# Patient Record
Sex: Male | Born: 1950 | Race: White | Hispanic: No | Marital: Married | Smoking: Current every day smoker
Health system: Southern US, Community
[De-identification: ages and names within clinical notes are randomized; demographics above are authoritative.]

## PROBLEM LIST (undated history)

## (undated) DIAGNOSIS — G709 Myoneural disorder, unspecified: Secondary | ICD-10-CM

## (undated) DIAGNOSIS — I10 Essential (primary) hypertension: Secondary | ICD-10-CM

## (undated) HISTORY — PX: TONSILLECTOMY: SUR1361

---

## 1991-02-08 HISTORY — PX: VASECTOMY: SHX75

## 1998-09-20 ENCOUNTER — Emergency Department (HOSPITAL_COMMUNITY): Admission: EM | Admit: 1998-09-20 | Discharge: 1998-09-20 | Payer: Self-pay | Admitting: Emergency Medicine

## 1998-09-20 ENCOUNTER — Encounter: Payer: Self-pay | Admitting: Emergency Medicine

## 2006-02-07 DIAGNOSIS — I10 Essential (primary) hypertension: Secondary | ICD-10-CM

## 2006-02-07 HISTORY — DX: Essential (primary) hypertension: I10

## 2009-08-19 ENCOUNTER — Emergency Department (HOSPITAL_COMMUNITY): Admission: EM | Admit: 2009-08-19 | Discharge: 2009-08-20 | Payer: Self-pay | Admitting: Emergency Medicine

## 2009-08-27 ENCOUNTER — Emergency Department (HOSPITAL_COMMUNITY): Admission: EM | Admit: 2009-08-27 | Discharge: 2009-08-27 | Payer: Self-pay | Admitting: Emergency Medicine

## 2010-04-24 LAB — DIFFERENTIAL
Basophils Absolute: 0 10*3/uL (ref 0.0–0.1)
Basophils Relative: 0 % (ref 0–1)
Eosinophils Absolute: 0.1 10*3/uL (ref 0.0–0.7)
Eosinophils Relative: 1 % (ref 0–5)
Lymphocytes Relative: 18 % (ref 12–46)
Lymphs Abs: 1.4 10*3/uL (ref 0.7–4.0)
Monocytes Absolute: 0.5 10*3/uL (ref 0.1–1.0)
Monocytes Relative: 7 % (ref 3–12)
Neutro Abs: 5.7 10*3/uL (ref 1.7–7.7)
Neutrophils Relative %: 74 % (ref 43–77)

## 2010-04-24 LAB — URINALYSIS, ROUTINE W REFLEX MICROSCOPIC
Bilirubin Urine: NEGATIVE
Glucose, UA: NEGATIVE mg/dL
Hgb urine dipstick: NEGATIVE
Ketones, ur: NEGATIVE mg/dL
Nitrite: NEGATIVE
Protein, ur: NEGATIVE mg/dL
Specific Gravity, Urine: 1.016 (ref 1.005–1.030)
Urobilinogen, UA: 0.2 mg/dL (ref 0.0–1.0)
pH: 8 (ref 5.0–8.0)

## 2010-04-24 LAB — MAGNESIUM: Magnesium: 2 mg/dL (ref 1.5–2.5)

## 2010-04-24 LAB — CBC
HCT: 45 % (ref 39.0–52.0)
Hemoglobin: 15.6 g/dL (ref 13.0–17.0)
MCH: 34.6 pg — ABNORMAL HIGH (ref 26.0–34.0)
MCHC: 34.6 g/dL (ref 30.0–36.0)
MCV: 99.8 fL (ref 78.0–100.0)
Platelets: 154 10*3/uL (ref 150–400)
RBC: 4.51 MIL/uL (ref 4.22–5.81)
RDW: 12.9 % (ref 11.5–15.5)
WBC: 7.8 10*3/uL (ref 4.0–10.5)

## 2010-04-24 LAB — PHOSPHORUS: Phosphorus: 3.9 mg/dL (ref 2.3–4.6)

## 2010-04-24 LAB — BASIC METABOLIC PANEL
BUN: 9 mg/dL (ref 6–23)
CO2: 26 mEq/L (ref 19–32)
Calcium: 8.9 mg/dL (ref 8.4–10.5)
Chloride: 97 mEq/L (ref 96–112)
Creatinine, Ser: 0.93 mg/dL (ref 0.4–1.5)
GFR calc Af Amer: >60 mL/min (ref >60)
GFR calc non Af Amer: >60 mL/min (ref >60)
Glucose, Bld: 115 mg/dL — ABNORMAL HIGH (ref 70–99)
Potassium: 3.7 mEq/L (ref 3.5–5.1)
Sodium: 131 mEq/L — ABNORMAL LOW (ref 135–145)

## 2010-04-25 LAB — COMPREHENSIVE METABOLIC PANEL WITH GFR
ALT: 15 U/L (ref 0–53)
AST: 25 U/L (ref 0–37)
Albumin: 4.1 g/dL (ref 3.5–5.2)
Alkaline Phosphatase: 40 U/L (ref 39–117)
BUN: 7 mg/dL (ref 6–23)
CO2: 25 meq/L (ref 19–32)
Calcium: 9.1 mg/dL (ref 8.4–10.5)
Chloride: 96 meq/L (ref 96–112)
Creatinine, Ser: 1.02 mg/dL (ref 0.4–1.5)
GFR calc Af Amer: >60 mL/min (ref >60)
GFR calc non Af Amer: >60 mL/min (ref >60)
Glucose, Bld: 111 mg/dL — ABNORMAL HIGH (ref 70–99)
Potassium: 3.6 meq/L (ref 3.5–5.1)
Sodium: 133 meq/L — ABNORMAL LOW (ref 135–145)
Total Bilirubin: 0.7 mg/dL (ref 0.3–1.2)
Total Protein: 6.7 g/dL (ref 6.0–8.3)

## 2010-04-25 LAB — ETHANOL: Alcohol, Ethyl (B): 85 mg/dL — ABNORMAL HIGH (ref 0–10)

## 2010-04-25 LAB — CBC
MCH: 34.3 pg — ABNORMAL HIGH (ref 26.0–34.0)
MCV: 99.5 fL (ref 78.0–100.0)
Platelets: 147 10*3/uL — ABNORMAL LOW (ref 150–400)
RDW: 13 % (ref 11.5–15.5)
WBC: 8.2 10*3/uL (ref 4.0–10.5)

## 2010-04-25 LAB — DIFFERENTIAL
Basophils Absolute: 0.1 K/uL (ref 0.0–0.1)
Basophils Relative: 1 % (ref 0–1)
Eosinophils Absolute: 0.2 K/uL (ref 0.0–0.7)
Eosinophils Relative: 3 % (ref 0–5)
Lymphocytes Relative: 21 % (ref 12–46)
Lymphs Abs: 1.7 K/uL (ref 0.7–4.0)
Monocytes Absolute: 0.5 K/uL (ref 0.1–1.0)
Monocytes Relative: 6 % (ref 3–12)
Neutro Abs: 5.7 K/uL (ref 1.7–7.7)
Neutrophils Relative %: 70 % (ref 43–77)

## 2011-02-17 NOTE — H&P (Signed)
NTS SOAP Note  Vital Signs:  Vitals as of: 02/17/2011: Systolic 164: Diastolic 105: Heart Rate 91: Temp 99.55F: Height 37ft 2in: Weight 196Lbs 0 Ounces: Pain Level 0: BMI 25  BMI : 25.16 kg/m2  Subjective: This 74 Years 24 Months old Male presents forscreening TCS.  Denies any GI complaints.  No family h/o colon carcinoma.  Review of Symptoms:  Constitutional:unremarkable Head:unremarkable Eyes:unremarkable Nose/Mouth/Throat:unremarkable Cardiovascular:unremarkable Respiratory:unremarkable Gastrointestinal:unremarkable Genitourinary:unremarkable joint pain Skin:unremarkable Hematolgic/Lymphatic:unremarkable Allergic/Immunologic:unremarkable   Past Medical History:Reviewed   Past Medical History  Surgical History: none Medical Problems:  High Blood pressure Allergies: nkda Medications: lisinopril   Social History:Reviewed   Social History  Preferred Language: English ([place]) Race:  White Ethnicity: Not Hispanic / Latino Age: 61 Years 3 Months Marital Status:  W Alcohol:  Yes, How much 12 per day Recreational drug(s):  No   Smoking Status: Current every day smoker reviewed on 02/17/2011 Started Date: 02/07/1973 Packs per day: 2.00   Family History:Reviewed   Family History  Is there a family history of:No family h/o colon cancer    Objective Information: General:Well appearing, well nourished in no distress. Skin:no rash or prominent lesions Head:Atraumatic; no masses; no abnormalities Neck:Supple without lymphadenopathy.  Heart:RRR, no murmur or gallop.  Normal S1, S2.  No S3, S4.  Lungs:CTA bilaterally, no wheezes, rhonchi, rales.  Breathing unlabored. Abdomen:Soft, NT/ND, no HSM, no masses. deferred to procedure  Assessment:Need for screening TCS  Diagnosis &amp; Procedure: DiagnosisCode: V76.51, ProcedureCode: 08657,    Plan:Scheduled for TCS on  02/22/11.   Patient Education:Alternative treatments to surgery were discussed with patient (and family).Risks and benefits  of procedure were fully explained to the patient (and family) who gave informed consent. Patient/family questions were addressed.  Follow-up:Pending Surgery

## 2011-02-21 MED ORDER — SODIUM CHLORIDE 0.45 % IV SOLN
Freq: Once | INTRAVENOUS | Status: AC
  Administered 2011-02-22: 20 mL via INTRAVENOUS

## 2011-02-22 ENCOUNTER — Encounter (HOSPITAL_COMMUNITY): Payer: Self-pay | Admitting: *Deleted

## 2011-02-22 ENCOUNTER — Encounter (HOSPITAL_COMMUNITY)
Admission: RE | Disposition: A | Discharge status: Home / Self Care | Payer: Self-pay | Source: Ambulatory Visit | Attending: General Surgery

## 2011-02-22 ENCOUNTER — Ambulatory Visit (HOSPITAL_COMMUNITY)
Admission: RE | Admit: 2011-02-22 | Discharge: 2011-02-22 | Disposition: A | Discharge status: Home / Self Care | Payer: Managed Care, Other (non HMO) | Source: Ambulatory Visit | Attending: General Surgery | Admitting: General Surgery

## 2011-02-22 DIAGNOSIS — Z79899 Other long term (current) drug therapy: Secondary | ICD-10-CM | POA: Insufficient documentation

## 2011-02-22 DIAGNOSIS — K648 Other hemorrhoids: Secondary | ICD-10-CM | POA: Insufficient documentation

## 2011-02-22 DIAGNOSIS — I10 Essential (primary) hypertension: Secondary | ICD-10-CM | POA: Insufficient documentation

## 2011-02-22 DIAGNOSIS — Z1211 Encounter for screening for malignant neoplasm of colon: Secondary | ICD-10-CM | POA: Insufficient documentation

## 2011-02-22 HISTORY — PX: COLONOSCOPY: SHX5424

## 2011-02-22 HISTORY — DX: Essential (primary) hypertension: I10

## 2011-02-22 HISTORY — DX: Myoneural disorder, unspecified: G70.9

## 2011-02-22 SURGERY — COLONOSCOPY
Anesthesia: Moderate Sedation

## 2011-02-22 MED ORDER — MEPERIDINE HCL 100 MG/ML IJ SOLN
INTRAMUSCULAR | Status: AC
  Filled 2011-02-22: qty 1

## 2011-02-22 MED ORDER — MIDAZOLAM HCL 5 MG/5ML IJ SOLN
INTRAMUSCULAR | Status: AC
  Filled 2011-02-22: qty 10

## 2011-02-22 MED ORDER — MIDAZOLAM HCL 5 MG/5ML IJ SOLN
INTRAMUSCULAR | Status: DC | PRN
  Administered 2011-02-22: 4 mg via INTRAVENOUS
  Administered 2011-02-22: 1 mg via INTRAVENOUS

## 2011-02-22 MED ORDER — SIMETHICONE 40 MG/0.6ML PO SUSP
ORAL | Status: DC | PRN
  Administered 2011-02-22: 08:00:00

## 2011-02-22 MED ORDER — MEPERIDINE HCL 25 MG/ML IJ SOLN
INTRAMUSCULAR | Status: DC | PRN
  Administered 2011-02-22: 50 mg via INTRAVENOUS

## 2011-02-22 NOTE — Op Note (Signed)
[facility] [address] [place], [place]  [address]  COLONOSCOPY PROCEDURE REPORT  PATIENT:  Kenneth Reeves, Kenneth Reeves  MR#:  [id number] BIRTHDATE:  Jan 02, 1951, 60 yrs. old  GENDER:  male ENDOSCOPIST:  Franky Macho, MD REF. BY:  Karleen Hampshire, M.D. PROCEDURE DATE:  02/22/2011 PROCEDURE:  Average-risk screening colonoscopy G0121 ASA CLASS:  Class II INDICATIONS:  Screening MEDICATIONS:   Versed 5 mg IV, demerol 50 mg IV  DESCRIPTION OF PROCEDURE:   After the risks benefits and alternatives of the procedure were thoroughly explained, informed consent was obtained.  Digital rectal exam was performed and revealed no abnormalities.   The [id number] ([id number]) endoscope was introduced through the anus and advanced to the cecum, which was identified by both the appendix and ileocecal valve, without limitations.  The quality of the prep was adequate..  The instrument was then slowly withdrawn as the colon was fully examined.  FINDINGS:  A normal appearing cecum, ileocecal valve, and appendiceal orifice were identified. The ascending, hepatic flexure, transverse, splenic flexure, descending, sigmoid colon, and rectum appeared unremarkable.   Retroflexed views in the rectum revealed internal hemorrhoids. The scope was then withdrawn from the cecum and the procedure completed. COMPLICATIONS:  None ENDOSCOPIC IMPRESSION: 1) Normal colon 2) Internal hemorrhoids RECOMMENDATIONS:  REPEAT EXAM:  In 10 year(s) for Colonoscopy.  ______________________________ Franky Macho, MD  CC:  Karleen Hampshire, MD  n. Rosalie DoctorFranky Macho at 02/22/2011 08:57 AM  Renelda Loma, [phone]

## 2011-03-01 ENCOUNTER — Encounter (HOSPITAL_COMMUNITY): Payer: Self-pay | Admitting: General Surgery

## 2013-11-20 ENCOUNTER — Emergency Department (HOSPITAL_COMMUNITY)
Admission: EM | Admit: 2013-11-20 | Discharge: 2013-11-20 | Disposition: A | Discharge status: Home / Self Care | Payer: No Typology Code available for payment source | Attending: Emergency Medicine | Admitting: Emergency Medicine

## 2013-11-20 ENCOUNTER — Encounter (HOSPITAL_COMMUNITY): Payer: Self-pay | Admitting: Emergency Medicine

## 2013-11-20 ENCOUNTER — Emergency Department (HOSPITAL_COMMUNITY): Payer: No Typology Code available for payment source

## 2013-11-20 DIAGNOSIS — Z720 Tobacco use: Secondary | ICD-10-CM | POA: Insufficient documentation

## 2013-11-20 DIAGNOSIS — Z79899 Other long term (current) drug therapy: Secondary | ICD-10-CM | POA: Insufficient documentation

## 2013-11-20 DIAGNOSIS — Z8669 Personal history of other diseases of the nervous system and sense organs: Secondary | ICD-10-CM | POA: Insufficient documentation

## 2013-11-20 DIAGNOSIS — S3992XA Unspecified injury of lower back, initial encounter: Secondary | ICD-10-CM | POA: Diagnosis present

## 2013-11-20 DIAGNOSIS — Y92410 Unspecified street and highway as the place of occurrence of the external cause: Secondary | ICD-10-CM | POA: Insufficient documentation

## 2013-11-20 DIAGNOSIS — S39012A Strain of muscle, fascia and tendon of lower back, initial encounter: Secondary | ICD-10-CM | POA: Insufficient documentation

## 2013-11-20 DIAGNOSIS — Y9389 Activity, other specified: Secondary | ICD-10-CM | POA: Insufficient documentation

## 2013-11-20 DIAGNOSIS — I10 Essential (primary) hypertension: Secondary | ICD-10-CM | POA: Diagnosis not present

## 2013-11-20 MED ORDER — CYCLOBENZAPRINE HCL 5 MG PO TABS: 5.0000 mg | ORAL_TABLET | Freq: Three times a day (TID) | ORAL | Status: DC | PRN

## 2013-11-20 MED ORDER — IBUPROFEN 600 MG PO TABS: 600.0000 mg | ORAL_TABLET | Freq: Four times a day (QID) | ORAL | Status: DC | PRN
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
## 2013-11-21 NOTE — ED Provider Notes (Signed)
Medical screening examination/treatment/procedure(s) were performed by non-physician practitioner and as supervising physician I was immediately available for consultation/collaboration.   EKG Interpretation None        Francine Graven, DO 11/21/13 [phone]

## 2018-01-29 DIAGNOSIS — N138 Other obstructive and reflux uropathy: Secondary | ICD-10-CM | POA: Diagnosis not present

## 2018-01-29 DIAGNOSIS — N401 Enlarged prostate with lower urinary tract symptoms: Secondary | ICD-10-CM | POA: Diagnosis not present

## 2018-01-29 DIAGNOSIS — R339 Retention of urine, unspecified: Secondary | ICD-10-CM | POA: Diagnosis not present

## 2018-02-06 ENCOUNTER — Ambulatory Visit (HOSPITAL_COMMUNITY)
Admission: EM | Admit: 2018-02-06 | Discharge: 2018-02-06 | Disposition: A | Discharge status: Home / Self Care | Payer: Medicare HMO | Attending: Family Medicine | Admitting: Family Medicine

## 2018-02-06 ENCOUNTER — Encounter (HOSPITAL_COMMUNITY): Payer: Self-pay

## 2018-02-06 DIAGNOSIS — I160 Hypertensive urgency: Secondary | ICD-10-CM

## 2018-02-06 DIAGNOSIS — I10 Essential (primary) hypertension: Secondary | ICD-10-CM | POA: Insufficient documentation

## 2018-02-06 MED ORDER — LOSARTAN POTASSIUM-HCTZ 50-12.5 MG PO TABS: 1.0000 | ORAL_TABLET | Freq: Every day | ORAL | 2 refills | Status: AC

## 2018-02-06 NOTE — ED Triage Notes (Signed)
Pt presents with elevated blood pressure.  Pt has no complaints of any other symptoms or any pain.

## 2018-02-06 NOTE — ED Notes (Signed)
Blood Pressure rechecked 178/127

## 2018-02-06 NOTE — Discharge Instructions (Addendum)
Losartan-HCTZ prescribed.  Take as directed. Please continue to monitor blood pressure at home and keep a log Eat a well balanced diet of fruits, vegetables and lean meats.  Avoid foods high in fat and salt Drink water.  At least half your body weight in ounces Exercise for at least 30 minutes daily PCP assistance initiated  Please follow up with PCP for further evaluation and management of high blood pressure I have included information for two local PCPs if you are not able to get in with your PCP Return or go to the ED if you have any new or worsening symptoms such as vision changes, fatigue, dizziness, chest pain, shortness of breath, nausea, swelling in your hands or feet, urinary symptoms, etc..Marland Kitchen

## 2018-02-06 NOTE — ED Provider Notes (Signed)
Kenneth Reeves   [id number] 02/06/18 Arrival Time: 6283  CC: Elevated blood pressure  SUBJECTIVE:  Kenneth Reeves is a 67 y.o. male who presents with elevated blood pressure.  Hx of HTN x 10-15 years.  Was on lisinopril in the past, but discontinued due to side effects.  Was seen by Urologist yesterday, and blood pressure was 185/127. 192/136 in office.  Pt tried to follow up with PCP, but was unable to get an appointment.  Does not have a PCP currently. Denies HA, vision changes, dizziness, lightheadedness, chest pain, shortness of breath, numbness or tingling in extremities, abdominal pain, changes in bowel or bladder habits.    Father died of MI at age of 55 Admits to tobacco use 1.5-2 PPD x 40+ years  ROS: As per HPI.  Past Medical History:  Diagnosis Date  . Hypertension 2008  . Neuromuscular disorder ([place])    Bad knee   Past Surgical History:  Procedure Laterality Date  . COLONOSCOPY  02/22/2011   Procedure: COLONOSCOPY;  Surgeon: Jamesetta So, MD;  Location: [facility];  Service: Gastroenterology;  Laterality: N/A;  . TONSILLECTOMY    . VASECTOMY  1993   No Known Allergies No current facility-administered medications on file prior to encounter.    No current outpatient medications on file prior to encounter.   Social History   Socioeconomic History  . Marital status: Married    Spouse name: Not on file  . Number of children: Not on file  . Years of education: Not on file  . Highest education level: Not on file  Occupational History  . Not on file  Social Needs  . Financial resource strain: Not on file  . Food insecurity:    Worry: Not on file    Inability: Not on file  . Transportation needs:    Medical: Not on file    Non-medical: Not on file  Tobacco Use  . Smoking status: Current Every Day Smoker    Packs/day: 2.00  Substance and Sexual Activity  . Alcohol use: Yes    Comment: few beers per day.  . Drug use: No  . Sexual activity: Not on  file  Lifestyle  . Physical activity:    Days per week: Not on file    Minutes per session: Not on file  . Stress: Not on file  Relationships  . Social connections:    Talks on phone: Not on file    Gets together: Not on file    Attends religious service: Not on file    Active member of club or organization: Not on file    Attends meetings of clubs or organizations: Not on file    Relationship status: Not on file  . Intimate partner violence:    Fear of current or ex partner: Not on file    Emotionally abused: Not on file    Physically abused: Not on file    Forced sexual activity: Not on file  Other Topics Concern  . Not on file  Social History Narrative  . Not on file   Family History  Problem Relation Age of Onset  . Heart attack Father     OBJECTIVE:  Vitals:   02/06/18 0845 02/06/18 0923  BP: (!) 192/136 (!) 178/127  Pulse: 93   Resp: 20   Temp: (!) 97.3 F (36.3 C)   TempSrc: Oral   SpO2: 97%     General appearance: alert; no distress Eyes: PERRLA; EOMI HENT: normocephalic;  atraumatic Neck: supple with FROM; no bruit Lungs: clear to auscultation bilaterally; normal respiratory effort Heart: regular rate and rhythm.  Radial pulses 2+ symmetrical bilaterally Skin: warm and dry Neurologic: CN 2-12 grossly intact;  normal gait Psychological: alert and cooperative; normal mood and affect   ASSESSMENT & PLAN:  1. Hypertensive urgency   2. Essential hypertension     Meds ordered this encounter  Medications  . losartan-hydrochlorothiazide (HYZAAR) 50-12.5 MG tablet    Sig: Take 1 tablet by mouth daily.    Dispense:  30 tablet    Refill:  2    Order Specific Question:   Supervising Provider    Answer:   Raylene Everts [[id number]]   Losartan-HCTZ prescribed.  Take as directed. Please continue to monitor blood pressure at home and keep a log Eat a well balanced diet of fruits, vegetables and lean meats.  Avoid foods high in fat and salt Drink water.   At least half your body weight in ounces Exercise for at least 30 minutes daily PCP assistance initiated  Please follow up with PCP for further evaluation and management of high blood pressure I have included information for two local PCPs if you are not able to get in with your PCP Return or go to the ED if you have any new or worsening symptoms such as vision changes, fatigue, dizziness, chest pain, shortness of breath, nausea, swelling in your hands or feet, urinary symptoms, etc...   Reviewed expectations re: course of current medical issues. Questions answered. Outlined signs and symptoms indicating need for more acute intervention. Patient verbalized understanding. After Visit Summary given.   Lestine Box, PA-C 02/06/18 1100

## 2018-02-07 DIAGNOSIS — C801 Malignant (primary) neoplasm, unspecified: Secondary | ICD-10-CM

## 2018-02-07 HISTORY — DX: Malignant (primary) neoplasm, unspecified: C80.1

## 2018-02-15 DIAGNOSIS — R972 Elevated prostate specific antigen [PSA]: Secondary | ICD-10-CM | POA: Diagnosis not present

## 2018-02-16 DIAGNOSIS — R69 Illness, unspecified: Secondary | ICD-10-CM | POA: Diagnosis not present

## 2018-02-16 DIAGNOSIS — R972 Elevated prostate specific antigen [PSA]: Secondary | ICD-10-CM | POA: Diagnosis not present

## 2018-02-23 DIAGNOSIS — C61 Malignant neoplasm of prostate: Secondary | ICD-10-CM | POA: Diagnosis not present

## 2018-02-23 DIAGNOSIS — R972 Elevated prostate specific antigen [PSA]: Secondary | ICD-10-CM | POA: Diagnosis not present

## 2018-03-05 DIAGNOSIS — R350 Frequency of micturition: Secondary | ICD-10-CM | POA: Diagnosis not present

## 2018-03-05 DIAGNOSIS — I10 Essential (primary) hypertension: Secondary | ICD-10-CM | POA: Diagnosis not present

## 2018-03-05 DIAGNOSIS — C61 Malignant neoplasm of prostate: Secondary | ICD-10-CM | POA: Diagnosis not present

## 2018-03-08 DIAGNOSIS — E663 Overweight: Secondary | ICD-10-CM | POA: Diagnosis not present

## 2018-03-08 DIAGNOSIS — C61 Malignant neoplasm of prostate: Secondary | ICD-10-CM | POA: Diagnosis not present

## 2018-03-08 DIAGNOSIS — Z0001 Encounter for general adult medical examination with abnormal findings: Secondary | ICD-10-CM | POA: Diagnosis not present

## 2018-03-08 DIAGNOSIS — Z6828 Body mass index (BMI) 28.0-28.9, adult: Secondary | ICD-10-CM | POA: Diagnosis not present

## 2018-03-08 DIAGNOSIS — I10 Essential (primary) hypertension: Secondary | ICD-10-CM | POA: Diagnosis not present

## 2018-03-19 DIAGNOSIS — I10 Essential (primary) hypertension: Secondary | ICD-10-CM | POA: Diagnosis not present

## 2018-03-19 DIAGNOSIS — R911 Solitary pulmonary nodule: Secondary | ICD-10-CM | POA: Diagnosis not present

## 2018-03-19 DIAGNOSIS — R69 Illness, unspecified: Secondary | ICD-10-CM | POA: Diagnosis not present

## 2018-03-19 DIAGNOSIS — C61 Malignant neoplasm of prostate: Secondary | ICD-10-CM | POA: Diagnosis not present

## 2018-03-19 DIAGNOSIS — C772 Secondary and unspecified malignant neoplasm of intra-abdominal lymph nodes: Secondary | ICD-10-CM | POA: Diagnosis not present

## 2018-03-19 DIAGNOSIS — R590 Localized enlarged lymph nodes: Secondary | ICD-10-CM | POA: Diagnosis not present

## 2018-03-19 DIAGNOSIS — N1330 Unspecified hydronephrosis: Secondary | ICD-10-CM | POA: Diagnosis not present

## 2018-03-21 DIAGNOSIS — R69 Illness, unspecified: Secondary | ICD-10-CM | POA: Diagnosis not present

## 2018-04-02 DIAGNOSIS — I10 Essential (primary) hypertension: Secondary | ICD-10-CM | POA: Diagnosis not present

## 2018-04-02 DIAGNOSIS — J432 Centrilobular emphysema: Secondary | ICD-10-CM | POA: Diagnosis not present

## 2018-04-02 DIAGNOSIS — R918 Other nonspecific abnormal finding of lung field: Secondary | ICD-10-CM | POA: Diagnosis not present

## 2018-04-02 DIAGNOSIS — R739 Hyperglycemia, unspecified: Secondary | ICD-10-CM | POA: Diagnosis not present

## 2018-04-02 DIAGNOSIS — C61 Malignant neoplasm of prostate: Secondary | ICD-10-CM | POA: Diagnosis not present

## 2018-04-02 DIAGNOSIS — Z79899 Other long term (current) drug therapy: Secondary | ICD-10-CM | POA: Diagnosis not present

## 2018-04-09 DIAGNOSIS — C61 Malignant neoplasm of prostate: Secondary | ICD-10-CM | POA: Diagnosis not present

## 2018-04-23 DIAGNOSIS — R911 Solitary pulmonary nodule: Secondary | ICD-10-CM | POA: Diagnosis not present

## 2018-04-23 DIAGNOSIS — Z79899 Other long term (current) drug therapy: Secondary | ICD-10-CM | POA: Diagnosis not present

## 2018-04-23 DIAGNOSIS — K5900 Constipation, unspecified: Secondary | ICD-10-CM | POA: Diagnosis not present

## 2018-04-23 DIAGNOSIS — C61 Malignant neoplasm of prostate: Secondary | ICD-10-CM | POA: Diagnosis not present

## 2018-04-23 DIAGNOSIS — R739 Hyperglycemia, unspecified: Secondary | ICD-10-CM | POA: Diagnosis not present

## 2018-04-23 DIAGNOSIS — D709 Neutropenia, unspecified: Secondary | ICD-10-CM | POA: Diagnosis not present

## 2018-04-23 DIAGNOSIS — K30 Functional dyspepsia: Secondary | ICD-10-CM | POA: Diagnosis not present

## 2018-04-23 DIAGNOSIS — I10 Essential (primary) hypertension: Secondary | ICD-10-CM | POA: Diagnosis not present

## 2018-04-24 DIAGNOSIS — Z7689 Persons encountering health services in other specified circumstances: Secondary | ICD-10-CM | POA: Diagnosis not present

## 2018-04-24 DIAGNOSIS — C61 Malignant neoplasm of prostate: Secondary | ICD-10-CM | POA: Diagnosis not present

## 2018-05-14 DIAGNOSIS — I10 Essential (primary) hypertension: Secondary | ICD-10-CM | POA: Diagnosis not present

## 2018-05-14 DIAGNOSIS — C61 Malignant neoplasm of prostate: Secondary | ICD-10-CM | POA: Diagnosis not present

## 2018-05-14 DIAGNOSIS — D709 Neutropenia, unspecified: Secondary | ICD-10-CM | POA: Diagnosis not present

## 2018-05-14 DIAGNOSIS — R911 Solitary pulmonary nodule: Secondary | ICD-10-CM | POA: Diagnosis not present

## 2018-05-15 DIAGNOSIS — C61 Malignant neoplasm of prostate: Secondary | ICD-10-CM | POA: Diagnosis not present

## 2018-06-04 DIAGNOSIS — R110 Nausea: Secondary | ICD-10-CM | POA: Diagnosis not present

## 2018-06-04 DIAGNOSIS — Z191 Hormone sensitive malignancy status: Secondary | ICD-10-CM | POA: Diagnosis not present

## 2018-06-04 DIAGNOSIS — C61 Malignant neoplasm of prostate: Secondary | ICD-10-CM | POA: Diagnosis not present

## 2018-06-04 DIAGNOSIS — Z5111 Encounter for antineoplastic chemotherapy: Secondary | ICD-10-CM | POA: Diagnosis not present

## 2018-06-04 DIAGNOSIS — D709 Neutropenia, unspecified: Secondary | ICD-10-CM | POA: Diagnosis not present

## 2018-06-04 DIAGNOSIS — R5383 Other fatigue: Secondary | ICD-10-CM | POA: Diagnosis not present

## 2018-06-04 DIAGNOSIS — Z79899 Other long term (current) drug therapy: Secondary | ICD-10-CM | POA: Diagnosis not present

## 2018-06-04 DIAGNOSIS — I10 Essential (primary) hypertension: Secondary | ICD-10-CM | POA: Diagnosis not present

## 2018-06-04 DIAGNOSIS — C772 Secondary and unspecified malignant neoplasm of intra-abdominal lymph nodes: Secondary | ICD-10-CM | POA: Diagnosis not present

## 2018-06-04 DIAGNOSIS — R911 Solitary pulmonary nodule: Secondary | ICD-10-CM | POA: Diagnosis not present

## 2018-06-05 DIAGNOSIS — C61 Malignant neoplasm of prostate: Secondary | ICD-10-CM | POA: Diagnosis not present

## 2018-06-05 DIAGNOSIS — C772 Secondary and unspecified malignant neoplasm of intra-abdominal lymph nodes: Secondary | ICD-10-CM | POA: Diagnosis not present

## 2018-06-25 DIAGNOSIS — R5383 Other fatigue: Secondary | ICD-10-CM | POA: Diagnosis not present

## 2018-06-25 DIAGNOSIS — I10 Essential (primary) hypertension: Secondary | ICD-10-CM | POA: Diagnosis not present

## 2018-06-25 DIAGNOSIS — Z5111 Encounter for antineoplastic chemotherapy: Secondary | ICD-10-CM | POA: Diagnosis not present

## 2018-06-25 DIAGNOSIS — Z79899 Other long term (current) drug therapy: Secondary | ICD-10-CM | POA: Diagnosis not present

## 2018-06-25 DIAGNOSIS — C61 Malignant neoplasm of prostate: Secondary | ICD-10-CM | POA: Diagnosis not present

## 2018-06-25 DIAGNOSIS — D709 Neutropenia, unspecified: Secondary | ICD-10-CM | POA: Diagnosis not present

## 2018-06-26 DIAGNOSIS — Z7689 Persons encountering health services in other specified circumstances: Secondary | ICD-10-CM | POA: Diagnosis not present

## 2018-06-26 DIAGNOSIS — C61 Malignant neoplasm of prostate: Secondary | ICD-10-CM | POA: Diagnosis not present

## 2018-07-16 DIAGNOSIS — R42 Dizziness and giddiness: Secondary | ICD-10-CM | POA: Diagnosis not present

## 2018-07-16 DIAGNOSIS — I10 Essential (primary) hypertension: Secondary | ICD-10-CM | POA: Diagnosis not present

## 2018-07-16 DIAGNOSIS — D709 Neutropenia, unspecified: Secondary | ICD-10-CM | POA: Diagnosis not present

## 2018-07-16 DIAGNOSIS — R110 Nausea: Secondary | ICD-10-CM | POA: Diagnosis not present

## 2018-07-16 DIAGNOSIS — R351 Nocturia: Secondary | ICD-10-CM | POA: Diagnosis not present

## 2018-07-16 DIAGNOSIS — Z79899 Other long term (current) drug therapy: Secondary | ICD-10-CM | POA: Diagnosis not present

## 2018-07-16 DIAGNOSIS — C61 Malignant neoplasm of prostate: Secondary | ICD-10-CM | POA: Diagnosis not present

## 2018-07-16 DIAGNOSIS — R5383 Other fatigue: Secondary | ICD-10-CM | POA: Diagnosis not present

## 2018-07-16 DIAGNOSIS — R911 Solitary pulmonary nodule: Secondary | ICD-10-CM | POA: Diagnosis not present

## 2018-07-17 DIAGNOSIS — C61 Malignant neoplasm of prostate: Secondary | ICD-10-CM | POA: Diagnosis not present

## 2018-08-13 DIAGNOSIS — R911 Solitary pulmonary nodule: Secondary | ICD-10-CM | POA: Diagnosis not present

## 2018-08-13 DIAGNOSIS — I10 Essential (primary) hypertension: Secondary | ICD-10-CM | POA: Diagnosis not present

## 2018-08-13 DIAGNOSIS — C61 Malignant neoplasm of prostate: Secondary | ICD-10-CM | POA: Diagnosis not present

## 2018-08-13 DIAGNOSIS — Z9221 Personal history of antineoplastic chemotherapy: Secondary | ICD-10-CM | POA: Diagnosis not present

## 2018-08-13 DIAGNOSIS — K7689 Other specified diseases of liver: Secondary | ICD-10-CM | POA: Diagnosis not present

## 2018-08-13 DIAGNOSIS — Z191 Hormone sensitive malignancy status: Secondary | ICD-10-CM | POA: Diagnosis not present

## 2018-09-17 DIAGNOSIS — I10 Essential (primary) hypertension: Secondary | ICD-10-CM | POA: Diagnosis not present

## 2018-09-17 DIAGNOSIS — Z79899 Other long term (current) drug therapy: Secondary | ICD-10-CM | POA: Diagnosis not present

## 2018-09-17 DIAGNOSIS — R232 Flushing: Secondary | ICD-10-CM | POA: Diagnosis not present

## 2018-09-17 DIAGNOSIS — C61 Malignant neoplasm of prostate: Secondary | ICD-10-CM | POA: Diagnosis not present

## 2018-09-28 DIAGNOSIS — E663 Overweight: Secondary | ICD-10-CM | POA: Diagnosis not present

## 2018-09-28 DIAGNOSIS — Z719 Counseling, unspecified: Secondary | ICD-10-CM | POA: Diagnosis not present

## 2018-09-28 DIAGNOSIS — Z6826 Body mass index (BMI) 26.0-26.9, adult: Secondary | ICD-10-CM | POA: Diagnosis not present

## 2018-09-28 DIAGNOSIS — I10 Essential (primary) hypertension: Secondary | ICD-10-CM | POA: Diagnosis not present

## 2018-09-28 DIAGNOSIS — E7849 Other hyperlipidemia: Secondary | ICD-10-CM | POA: Diagnosis not present

## 2018-10-03 DIAGNOSIS — R69 Illness, unspecified: Secondary | ICD-10-CM | POA: Diagnosis not present

## 2018-12-17 DIAGNOSIS — Z79818 Long term (current) use of other agents affecting estrogen receptors and estrogen levels: Secondary | ICD-10-CM | POA: Diagnosis not present

## 2018-12-17 DIAGNOSIS — R232 Flushing: Secondary | ICD-10-CM | POA: Diagnosis not present

## 2018-12-17 DIAGNOSIS — C61 Malignant neoplasm of prostate: Secondary | ICD-10-CM | POA: Diagnosis not present

## 2018-12-17 DIAGNOSIS — N138 Other obstructive and reflux uropathy: Secondary | ICD-10-CM | POA: Diagnosis not present

## 2018-12-17 DIAGNOSIS — N401 Enlarged prostate with lower urinary tract symptoms: Secondary | ICD-10-CM | POA: Diagnosis not present

## 2018-12-17 DIAGNOSIS — I10 Essential (primary) hypertension: Secondary | ICD-10-CM | POA: Diagnosis not present

## 2019-01-21 DIAGNOSIS — R69 Illness, unspecified: Secondary | ICD-10-CM | POA: Diagnosis not present

## 2019-01-21 DIAGNOSIS — Z8546 Personal history of malignant neoplasm of prostate: Secondary | ICD-10-CM | POA: Diagnosis not present

## 2019-01-21 DIAGNOSIS — Z8589 Personal history of malignant neoplasm of other organs and systems: Secondary | ICD-10-CM | POA: Diagnosis not present

## 2019-01-21 DIAGNOSIS — N400 Enlarged prostate without lower urinary tract symptoms: Secondary | ICD-10-CM | POA: Diagnosis not present

## 2019-01-21 DIAGNOSIS — I10 Essential (primary) hypertension: Secondary | ICD-10-CM | POA: Diagnosis not present

## 2019-01-21 DIAGNOSIS — H547 Unspecified visual loss: Secondary | ICD-10-CM | POA: Diagnosis not present

## 2019-03-18 DIAGNOSIS — Z79899 Other long term (current) drug therapy: Secondary | ICD-10-CM | POA: Diagnosis not present

## 2019-03-18 DIAGNOSIS — R232 Flushing: Secondary | ICD-10-CM | POA: Diagnosis not present

## 2019-03-18 DIAGNOSIS — R972 Elevated prostate specific antigen [PSA]: Secondary | ICD-10-CM | POA: Diagnosis not present

## 2019-03-18 DIAGNOSIS — R5383 Other fatigue: Secondary | ICD-10-CM | POA: Diagnosis not present

## 2019-03-18 DIAGNOSIS — C61 Malignant neoplasm of prostate: Secondary | ICD-10-CM | POA: Diagnosis not present

## 2019-03-18 DIAGNOSIS — I10 Essential (primary) hypertension: Secondary | ICD-10-CM | POA: Diagnosis not present

## 2019-04-05 DIAGNOSIS — J449 Chronic obstructive pulmonary disease, unspecified: Secondary | ICD-10-CM | POA: Diagnosis not present

## 2019-04-05 DIAGNOSIS — Z719 Counseling, unspecified: Secondary | ICD-10-CM | POA: Diagnosis not present

## 2019-04-05 DIAGNOSIS — I10 Essential (primary) hypertension: Secondary | ICD-10-CM | POA: Diagnosis not present

## 2019-04-05 DIAGNOSIS — C61 Malignant neoplasm of prostate: Secondary | ICD-10-CM | POA: Diagnosis not present

## 2019-04-05 DIAGNOSIS — Z0001 Encounter for general adult medical examination with abnormal findings: Secondary | ICD-10-CM | POA: Diagnosis not present

## 2019-04-05 DIAGNOSIS — Z6827 Body mass index (BMI) 27.0-27.9, adult: Secondary | ICD-10-CM | POA: Diagnosis not present

## 2019-04-05 DIAGNOSIS — Z1389 Encounter for screening for other disorder: Secondary | ICD-10-CM | POA: Diagnosis not present

## 2019-04-05 DIAGNOSIS — E7849 Other hyperlipidemia: Secondary | ICD-10-CM | POA: Diagnosis not present

## 2019-04-12 DIAGNOSIS — R69 Illness, unspecified: Secondary | ICD-10-CM | POA: Diagnosis not present

## 2019-04-23 DIAGNOSIS — R69 Illness, unspecified: Secondary | ICD-10-CM | POA: Diagnosis not present

## 2019-05-22 DIAGNOSIS — R69 Illness, unspecified: Secondary | ICD-10-CM | POA: Diagnosis not present

## 2019-05-28 DIAGNOSIS — D7289 Other specified disorders of white blood cells: Secondary | ICD-10-CM | POA: Diagnosis not present

## 2019-06-17 DIAGNOSIS — Z79818 Long term (current) use of other agents affecting estrogen receptors and estrogen levels: Secondary | ICD-10-CM | POA: Diagnosis not present

## 2019-06-17 DIAGNOSIS — C61 Malignant neoplasm of prostate: Secondary | ICD-10-CM | POA: Diagnosis not present

## 2019-06-17 DIAGNOSIS — R232 Flushing: Secondary | ICD-10-CM | POA: Diagnosis not present

## 2019-06-17 DIAGNOSIS — Y92538 Other ambulatory health services establishments as the place of occurrence of the external cause: Secondary | ICD-10-CM | POA: Diagnosis not present

## 2019-06-17 DIAGNOSIS — I10 Essential (primary) hypertension: Secondary | ICD-10-CM | POA: Diagnosis not present

## 2019-06-17 DIAGNOSIS — Z9221 Personal history of antineoplastic chemotherapy: Secondary | ICD-10-CM | POA: Diagnosis not present

## 2019-06-17 DIAGNOSIS — T385X5A Adverse effect of other estrogens and progestogens, initial encounter: Secondary | ICD-10-CM | POA: Diagnosis not present

## 2019-06-17 DIAGNOSIS — R972 Elevated prostate specific antigen [PSA]: Secondary | ICD-10-CM | POA: Diagnosis not present

## 2019-09-16 DIAGNOSIS — Z79899 Other long term (current) drug therapy: Secondary | ICD-10-CM | POA: Diagnosis not present

## 2019-09-16 DIAGNOSIS — T50905A Adverse effect of unspecified drugs, medicaments and biological substances, initial encounter: Secondary | ICD-10-CM | POA: Diagnosis not present

## 2019-09-16 DIAGNOSIS — Z192 Hormone resistant malignancy status: Secondary | ICD-10-CM | POA: Diagnosis not present

## 2019-09-16 DIAGNOSIS — R972 Elevated prostate specific antigen [PSA]: Secondary | ICD-10-CM | POA: Diagnosis not present

## 2019-09-16 DIAGNOSIS — I10 Essential (primary) hypertension: Secondary | ICD-10-CM | POA: Diagnosis not present

## 2019-09-16 DIAGNOSIS — C61 Malignant neoplasm of prostate: Secondary | ICD-10-CM | POA: Diagnosis not present

## 2019-09-16 DIAGNOSIS — Z5111 Encounter for antineoplastic chemotherapy: Secondary | ICD-10-CM | POA: Diagnosis not present

## 2019-09-16 DIAGNOSIS — R232 Flushing: Secondary | ICD-10-CM | POA: Diagnosis not present

## 2019-09-23 DIAGNOSIS — R911 Solitary pulmonary nodule: Secondary | ICD-10-CM | POA: Diagnosis not present

## 2019-09-23 DIAGNOSIS — C61 Malignant neoplasm of prostate: Secondary | ICD-10-CM | POA: Diagnosis not present

## 2019-09-23 DIAGNOSIS — R590 Localized enlarged lymph nodes: Secondary | ICD-10-CM | POA: Diagnosis not present

## 2019-09-30 DIAGNOSIS — R232 Flushing: Secondary | ICD-10-CM | POA: Diagnosis not present

## 2019-09-30 DIAGNOSIS — I10 Essential (primary) hypertension: Secondary | ICD-10-CM | POA: Diagnosis not present

## 2019-09-30 DIAGNOSIS — C61 Malignant neoplasm of prostate: Secondary | ICD-10-CM | POA: Diagnosis not present

## 2019-10-25 DIAGNOSIS — R69 Illness, unspecified: Secondary | ICD-10-CM | POA: Diagnosis not present

## 2019-10-28 DIAGNOSIS — C61 Malignant neoplasm of prostate: Secondary | ICD-10-CM | POA: Diagnosis not present

## 2019-11-04 DIAGNOSIS — R9721 Rising PSA following treatment for malignant neoplasm of prostate: Secondary | ICD-10-CM | POA: Diagnosis not present

## 2019-11-04 DIAGNOSIS — R232 Flushing: Secondary | ICD-10-CM | POA: Diagnosis not present

## 2019-11-04 DIAGNOSIS — M899 Disorder of bone, unspecified: Secondary | ICD-10-CM | POA: Diagnosis not present

## 2019-11-04 DIAGNOSIS — I10 Essential (primary) hypertension: Secondary | ICD-10-CM | POA: Diagnosis not present

## 2019-11-04 DIAGNOSIS — R5383 Other fatigue: Secondary | ICD-10-CM | POA: Diagnosis not present

## 2019-11-04 DIAGNOSIS — C61 Malignant neoplasm of prostate: Secondary | ICD-10-CM | POA: Diagnosis not present

## 2019-11-04 DIAGNOSIS — R599 Enlarged lymph nodes, unspecified: Secondary | ICD-10-CM | POA: Diagnosis not present

## 2019-11-25 DIAGNOSIS — N138 Other obstructive and reflux uropathy: Secondary | ICD-10-CM | POA: Diagnosis not present

## 2019-11-25 DIAGNOSIS — N401 Enlarged prostate with lower urinary tract symptoms: Secondary | ICD-10-CM | POA: Diagnosis not present

## 2019-11-25 DIAGNOSIS — C61 Malignant neoplasm of prostate: Secondary | ICD-10-CM | POA: Diagnosis not present

## 2019-12-02 DIAGNOSIS — Z9221 Personal history of antineoplastic chemotherapy: Secondary | ICD-10-CM | POA: Diagnosis not present

## 2019-12-02 DIAGNOSIS — I10 Essential (primary) hypertension: Secondary | ICD-10-CM | POA: Diagnosis not present

## 2019-12-02 DIAGNOSIS — C61 Malignant neoplasm of prostate: Secondary | ICD-10-CM | POA: Diagnosis not present

## 2019-12-02 DIAGNOSIS — Z79818 Long term (current) use of other agents affecting estrogen receptors and estrogen levels: Secondary | ICD-10-CM | POA: Diagnosis not present

## 2019-12-02 DIAGNOSIS — R232 Flushing: Secondary | ICD-10-CM | POA: Diagnosis not present

## 2019-12-02 DIAGNOSIS — Z7952 Long term (current) use of systemic steroids: Secondary | ICD-10-CM | POA: Diagnosis not present

## 2019-12-02 DIAGNOSIS — Z192 Hormone resistant malignancy status: Secondary | ICD-10-CM | POA: Diagnosis not present

## 2019-12-02 DIAGNOSIS — R9721 Rising PSA following treatment for malignant neoplasm of prostate: Secondary | ICD-10-CM | POA: Diagnosis not present

## 2019-12-11 DIAGNOSIS — Z803 Family history of malignant neoplasm of breast: Secondary | ICD-10-CM | POA: Diagnosis not present

## 2019-12-11 DIAGNOSIS — R69 Illness, unspecified: Secondary | ICD-10-CM | POA: Diagnosis not present

## 2019-12-11 DIAGNOSIS — E785 Hyperlipidemia, unspecified: Secondary | ICD-10-CM | POA: Diagnosis not present

## 2019-12-11 DIAGNOSIS — Z7952 Long term (current) use of systemic steroids: Secondary | ICD-10-CM | POA: Diagnosis not present

## 2019-12-11 DIAGNOSIS — C61 Malignant neoplasm of prostate: Secondary | ICD-10-CM | POA: Diagnosis not present

## 2019-12-11 DIAGNOSIS — I10 Essential (primary) hypertension: Secondary | ICD-10-CM | POA: Diagnosis not present

## 2019-12-11 DIAGNOSIS — I739 Peripheral vascular disease, unspecified: Secondary | ICD-10-CM | POA: Diagnosis not present

## 2019-12-11 DIAGNOSIS — N400 Enlarged prostate without lower urinary tract symptoms: Secondary | ICD-10-CM | POA: Diagnosis not present

## 2019-12-11 DIAGNOSIS — Z79899 Other long term (current) drug therapy: Secondary | ICD-10-CM | POA: Diagnosis not present

## 2019-12-11 DIAGNOSIS — J439 Emphysema, unspecified: Secondary | ICD-10-CM | POA: Diagnosis not present

## 2020-02-04 DIAGNOSIS — R0902 Hypoxemia: Secondary | ICD-10-CM | POA: Diagnosis not present

## 2020-02-04 DIAGNOSIS — R52 Pain, unspecified: Secondary | ICD-10-CM | POA: Diagnosis not present

## 2020-02-25 ENCOUNTER — Emergency Department (HOSPITAL_COMMUNITY): Payer: Medicare HMO

## 2020-02-25 ENCOUNTER — Other Ambulatory Visit: Payer: Self-pay

## 2020-02-25 ENCOUNTER — Encounter (HOSPITAL_COMMUNITY): Payer: Self-pay | Admitting: Emergency Medicine

## 2020-02-25 ENCOUNTER — Inpatient Hospital Stay (HOSPITAL_COMMUNITY)
Admission: EM | Admit: 2020-02-25 | Discharge: 2020-02-28 | DRG: 481 | Disposition: A | Discharge status: Home Health | Payer: Medicare HMO | Attending: Family Medicine | Admitting: Family Medicine

## 2020-02-25 DIAGNOSIS — E876 Hypokalemia: Secondary | ICD-10-CM | POA: Diagnosis not present

## 2020-02-25 DIAGNOSIS — N400 Enlarged prostate without lower urinary tract symptoms: Secondary | ICD-10-CM | POA: Diagnosis present

## 2020-02-25 DIAGNOSIS — R739 Hyperglycemia, unspecified: Secondary | ICD-10-CM | POA: Diagnosis not present

## 2020-02-25 DIAGNOSIS — C61 Malignant neoplasm of prostate: Secondary | ICD-10-CM

## 2020-02-25 DIAGNOSIS — W19XXXA Unspecified fall, initial encounter: Secondary | ICD-10-CM | POA: Diagnosis not present

## 2020-02-25 DIAGNOSIS — R52 Pain, unspecified: Secondary | ICD-10-CM | POA: Diagnosis not present

## 2020-02-25 DIAGNOSIS — F17210 Nicotine dependence, cigarettes, uncomplicated: Secondary | ICD-10-CM | POA: Diagnosis present

## 2020-02-25 DIAGNOSIS — Z8546 Personal history of malignant neoplasm of prostate: Secondary | ICD-10-CM | POA: Diagnosis not present

## 2020-02-25 DIAGNOSIS — Z20822 Contact with and (suspected) exposure to covid-19: Secondary | ICD-10-CM | POA: Diagnosis not present

## 2020-02-25 DIAGNOSIS — S72002A Fracture of unspecified part of neck of left femur, initial encounter for closed fracture: Secondary | ICD-10-CM | POA: Diagnosis not present

## 2020-02-25 DIAGNOSIS — Z043 Encounter for examination and observation following other accident: Secondary | ICD-10-CM | POA: Diagnosis not present

## 2020-02-25 DIAGNOSIS — Z743 Need for continuous supervision: Secondary | ICD-10-CM | POA: Diagnosis not present

## 2020-02-25 DIAGNOSIS — I10 Essential (primary) hypertension: Secondary | ICD-10-CM

## 2020-02-25 DIAGNOSIS — E871 Hypo-osmolality and hyponatremia: Secondary | ICD-10-CM | POA: Diagnosis present

## 2020-02-25 DIAGNOSIS — W000XXA Fall on same level due to ice and snow, initial encounter: Secondary | ICD-10-CM | POA: Diagnosis present

## 2020-02-25 DIAGNOSIS — R9431 Abnormal electrocardiogram [ECG] [EKG]: Secondary | ICD-10-CM

## 2020-02-25 DIAGNOSIS — D72829 Elevated white blood cell count, unspecified: Secondary | ICD-10-CM

## 2020-02-25 DIAGNOSIS — Z8249 Family history of ischemic heart disease and other diseases of the circulatory system: Secondary | ICD-10-CM

## 2020-02-25 DIAGNOSIS — S79912A Unspecified injury of left hip, initial encounter: Secondary | ICD-10-CM | POA: Diagnosis not present

## 2020-02-25 DIAGNOSIS — R531 Weakness: Secondary | ICD-10-CM | POA: Diagnosis not present

## 2020-02-25 DIAGNOSIS — Y92008 Other place in unspecified non-institutional (private) residence as the place of occurrence of the external cause: Secondary | ICD-10-CM | POA: Diagnosis not present

## 2020-02-25 DIAGNOSIS — R0902 Hypoxemia: Secondary | ICD-10-CM | POA: Diagnosis not present

## 2020-02-25 LAB — COMPREHENSIVE METABOLIC PANEL
ALT: 26 U/L (ref 0–44)
AST: 24 U/L (ref 15–41)
Albumin: 3.7 g/dL (ref 3.5–5.0)
Alkaline Phosphatase: 72 U/L (ref 38–126)
Anion gap: 9 (ref 5–15)
BUN: 12 mg/dL (ref 8–23)
CO2: 27 mmol/L (ref 22–32)
Calcium: 8.7 mg/dL — ABNORMAL LOW (ref 8.9–10.3)
Chloride: 99 mmol/L (ref 98–111)
Creatinine, Ser: 1.08 mg/dL (ref 0.61–1.24)
GFR, Estimated: >60 mL/min (ref >60)
Glucose, Bld: 156 mg/dL — ABNORMAL HIGH (ref 70–99)
Potassium: 2.7 mmol/L — CL (ref 3.5–5.1)
Sodium: 135 mmol/L (ref 135–145)
Total Bilirubin: 0.7 mg/dL (ref 0.3–1.2)
Total Protein: 6.4 g/dL — ABNORMAL LOW (ref 6.5–8.1)

## 2020-02-25 LAB — CBC WITH DIFFERENTIAL/PLATELET
Abs Immature Granulocytes: 0.05 10*3/uL (ref 0.00–0.07)
Basophils Absolute: 0 10*3/uL (ref 0.0–0.1)
Basophils Relative: 0 %
Eosinophils Absolute: 0 10*3/uL (ref 0.0–0.5)
Eosinophils Relative: 0 %
HCT: 37.2 % — ABNORMAL LOW (ref 39.0–52.0)
Hemoglobin: 12.6 g/dL — ABNORMAL LOW (ref 13.0–17.0)
Immature Granulocytes: 0 %
Lymphocytes Relative: 8 %
Lymphs Abs: 1 10*3/uL (ref 0.7–4.0)
MCH: 32.1 pg (ref 26.0–34.0)
MCHC: 33.9 g/dL (ref 30.0–36.0)
MCV: 94.7 fL (ref 80.0–100.0)
Monocytes Absolute: 0.7 10*3/uL (ref 0.1–1.0)
Monocytes Relative: 6 %
Neutro Abs: 10 10*3/uL — ABNORMAL HIGH (ref 1.7–7.7)
Neutrophils Relative %: 86 %
Platelets: 202 10*3/uL (ref 150–400)
RBC: 3.93 MIL/uL — ABNORMAL LOW (ref 4.22–5.81)
RDW: 12.3 % (ref 11.5–15.5)
WBC: 11.8 10*3/uL — ABNORMAL HIGH (ref 4.0–10.5)
nRBC: 0 % (ref 0.0–0.2)

## 2020-02-25 LAB — ABO/RH: ABO/RH(D): A NEG

## 2020-02-25 LAB — PROTIME-INR
INR: 0.9 (ref 0.8–1.2)
Prothrombin Time: 11.9 seconds (ref 11.4–15.2)

## 2020-02-25 LAB — TYPE AND SCREEN
ABO/RH(D): A NEG
Antibody Screen: NEGATIVE

## 2020-02-25 MED ORDER — HYDROCODONE-ACETAMINOPHEN 5-325 MG PO TABS
1.0000 | ORAL_TABLET | ORAL | Status: DC | PRN
  Administered 2020-02-25 – 2020-02-27 (×4): 1 via ORAL
  Filled 2020-02-25 (×5): qty 1

## 2020-02-25 MED ORDER — DIAZEPAM 5 MG PO TABS: 5.0000 mg | ORAL_TABLET | Freq: Once | ORAL | Status: DC

## 2020-02-25 MED ORDER — HYDROMORPHONE HCL 1 MG/ML IJ SOLN
1.0000 mg | Freq: Once | INTRAMUSCULAR | Status: AC
  Administered 2020-02-25: 1 mg via INTRAVENOUS

## 2020-02-25 MED ORDER — HYDROMORPHONE HCL 1 MG/ML IJ SOLN
0.5000 mg | Freq: Once | INTRAMUSCULAR | Status: AC
  Administered 2020-02-25: 0.5 mg via INTRAVENOUS
  Filled 2020-02-25: qty 1

## 2020-02-25 MED ORDER — HYDROMORPHONE HCL 1 MG/ML IJ SOLN
1.0000 mg | Freq: Once | INTRAMUSCULAR | Status: DC
  Filled 2020-02-25: qty 1

## 2020-02-25 MED ORDER — HEPARIN SODIUM (PORCINE) 5000 UNIT/ML IJ SOLN
5000.0000 [IU] | Freq: Three times a day (TID) | INTRAMUSCULAR | Status: DC
  Administered 2020-02-26 – 2020-02-27 (×4): 5000 [IU] via SUBCUTANEOUS
  Filled 2020-02-25 (×4): qty 1

## 2020-02-25 MED ORDER — DIAZEPAM 2 MG PO TABS
2.0000 mg | ORAL_TABLET | Freq: Once | ORAL | Status: AC
  Administered 2020-02-25: 2 mg via ORAL
  Filled 2020-02-25: qty 1

## 2020-02-25 NOTE — ED Notes (Signed)
CRITICAL VALUE ALERT  Critical Value: Potassium 2.7 Date & Time Notied: 02/25/20 @ 2220 Provider Notified: Dr Kathrynn Humble Orders Received/Actions taken: None yet

## 2020-02-25 NOTE — ED Provider Notes (Signed)
[facility] EMERGENCY DEPARTMENT Provider Note   CSN: [id number] Arrival date & time: 02/25/20  1750     History Chief Complaint  Patient presents with  . Fall    Kenneth Reeves is a 70 y.o. male.  HPI    70 year old male comes with a chief complaint of fall. Patient slipped outside his house, fell onto his back and started having severe left-sided pain.  Patient has not been able to bear weight.  The fall was onto ice as slipped while climbing down stairs.  Patient denies any numbness, tingling.  He denies any trauma to his head, headaches, nausea, vomiting, vision change.  Fall occurred around 4 PM.  Patient is not on any blood thinners and does not want to get a CT scan of the brain.  Past Medical History:  Diagnosis Date  . Hypertension 2008  . Neuromuscular disorder ([place])    Bad knee    There are no problems to display for this patient.   Past Surgical History:  Procedure Laterality Date  . COLONOSCOPY  02/22/2011   Procedure: COLONOSCOPY;  Surgeon: Jamesetta So, MD;  [facility];  Service: Gastroenterology;  Laterality: N/A;  . TONSILLECTOMY    . VASECTOMY  1993       Family History  Problem Relation Age of Onset  . Heart attack Father     Social History   Tobacco Use  . Smoking status: Current Every Day Smoker    Packs/day: 2.00  Substance Use Topics  . Alcohol use: Yes    Comment: few beers per day.  . Drug use: No    Home Medications Prior to Admission medications   Medication Sig Start Date End Date Taking? Authorizing Provider  abiraterone acetate (ZYTIGA) 500 MG tablet Take 1,000 mg by mouth daily. 12/27/19  Yes [provider]  hydrochlorothiazide (HYDRODIURIL) 25 MG tablet Take 25 mg by mouth daily. 01/31/20  Yes [provider]  losartan (COZAAR) 100 MG tablet Take 100 mg by mouth daily. 01/31/20  Yes [provider]  predniSONE (DELTASONE) 5 MG tablet Take 5 mg by mouth daily with breakfast. 12/27/19   Yes [provider]  tamsulosin (FLOMAX) 0.4 MG CAPS capsule Take 0.4 mg by mouth daily. 11/04/19  Yes [provider]  losartan-hydrochlorothiazide (HYZAAR) 50-12.5 MG tablet Take 1 tablet by mouth daily. Patient not taking: No sig reported 02/06/18   Stacey Drain [place], PA-C    Allergies    Patient has no known allergies.  Review of Systems   Review of Systems  Constitutional: Positive for activity change.  Respiratory: Negative for shortness of breath.   Cardiovascular: Negative for chest pain.  Gastrointestinal: Negative for abdominal pain.  Musculoskeletal: Positive for arthralgias and myalgias.  Hematological: Does not bruise/bleed easily.  All other systems reviewed and are negative.   Physical Exam Updated Vital Signs BP 119/77   Pulse 80   Temp 97.7 F (36.5 C) (Oral)   Resp 18   Ht 6' (1.829 m)   Wt 95.3 kg   SpO2 100%   BMI 28.48 kg/m   Physical Exam Vitals and nursing note reviewed.  Constitutional:      Appearance: He is well-developed.  HENT:     Head: Atraumatic.  Neck:     Comments: No midline c-spine tenderness, pt able to turn head to 45 degrees bilaterally without any pain and able to flex neck to the chest and extend without any pain or neurologic symptoms.  Cardiovascular:  Rate and Rhythm: Normal rate.  Pulmonary:     Effort: Pulmonary effort is normal.  Musculoskeletal:     Cervical back: Neck supple.     Comments: Patient is tenderness over the left hip, OTHERWISE:  Head to toe evaluation shows no hematoma, bleeding of the scalp, no facial abrasions, no spine step offs, crepitus of the chest or neck, no tenderness to palpation of the bilateral upper and lower extremities, no gross deformities, no chest tenderness, no pelvic pain.   Skin:    General: Skin is warm.  Neurological:     Mental Status: He is alert and oriented to person, place, and time.     ED Results / Procedures / Treatments   Labs (all labs ordered  are listed, but only abnormal results are displayed) Labs Reviewed  SARS CORONAVIRUS 2 (TAT 6-24 HRS)  CBC WITH DIFFERENTIAL/PLATELET  COMPREHENSIVE METABOLIC PANEL  PROTIME-INR  TYPE AND SCREEN    EKG EKG Interpretation  Date/Time:  Tuesday February 25 2020 20:37:26 EST Ventricular Rate:  84 PR Interval:    QRS Duration: 115 QT Interval:  422 QTC Calculation: 499 R Axis:   58 Text Interpretation: Sinus rhythm Probable left atrial enlargement Nonspecific intraventricular conduction delay Anteroseptal infarct, age indeterminate No acute changes No significant change since last tracing Confirmed by Varney Biles [id number]) on 02/25/2020 8:46:25 PM   Radiology DG Chest Port 1 View  Result Date: 02/25/2020 CLINICAL DATA:  Status post fall. EXAM: PORTABLE CHEST 1 VIEW COMPARISON:  None. FINDINGS: Mild, diffuse, chronic appearing increased lung markings are seen. There is no evidence of acute infiltrate, pleural effusion or pneumothorax. The heart size and mediastinal contours are within normal limits. The visualized skeletal structures are unremarkable. IMPRESSION: No active disease. Electronically Signed   By: Virgina Norfolk M.D.   On: 02/25/2020 20:53   DG Hip Unilat W or Wo Pelvis 2-3 Views Left  Result Date: 02/25/2020 CLINICAL DATA:  fell 3 steps hitting his left hip. EXAM: DG HIP (WITH OR WITHOUT PELVIS) 2-3V LEFT COMPARISON:  X-ray pelvis 11/20/2013. FINDINGS: Subcapital versus impacted transcervical left femoral neck fracture. Frontal view of the pelvis and right hip are grossly unremarkable. There is no evidence of severe arthropathy or other focal bone abnormality. Subcutaneus soft tissue are grossly unremarkable. Vascular calcifications. IMPRESSION: Subcapital versus impacted transcervical left femoral neck fracture. Electronically Signed   By: Iven Finn M.D.   On: 02/25/2020 19:41    Procedures Procedures (including critical care time)  Medications Ordered in  ED Medications  HYDROcodone-acetaminophen (NORCO/VICODIN) 5-325 MG per tablet 1 tablet (1 tablet Oral Given 02/25/20 2051)  HYDROmorphone (DILAUDID) injection 0.5 mg (has no administration in time range)  diazepam (VALIUM) tablet 2 mg (has no administration in time range)  HYDROmorphone (DILAUDID) injection 1 mg (1 mg Intravenous Given 02/25/20 1831)    ED Course  I have reviewed the triage vital signs and the nursing notes.  Pertinent labs & imaging results that were available during my care of the patient were reviewed by me and considered in my medical decision making (see chart for details).    MDM Rules/Calculators/A&P                          70 year old male comes with a chief complaint of fall. Patient has history of metastatic prostate cancer and had a mechanical fall while walking downstairs.  Fell onto his left hip and is having severe pain, unable to bear weight.  Neurovascularly intact. not on any blood thinners.  Patient does not have any headaches.  No red flags for elevated ICP.  Patient has declined getting a CT head since he never struck his head onto any other surface.  I discussed the risk and benefits of getting a CT head versus not, and patient is agreeable to not getting CT scan and letting us know if he starts having any headaches or neurologic symptoms  C-spine has been cleared clinically.  9:03 PM Patient is x-ray had revealed hip fracture.  I had discussed the case with Dr. Aline Brochure, orthopedic surgery at any pain.  He had requested that patient be admitted to the hospital.  I then went and gave an update to the patient.  He had informed me again that he would like to get the surgery done at [facility] health as that is where he is getting all his tertiary level of care.  I informed him that most hospitals are on diversion because of increased COVID-19 related surge, and I tried to alleviate any anxiety he might have had about having surgery done at [facility]  hospital.  I informed her that Dr. Aline Brochure has been serving this community for decades.  Patient and daughter are still insistent that at least we try calling [facility] to see if they will accept the patient.  Transfer call has been placed at this time.  Final Clinical Impression(s) / ED Diagnoses Final diagnoses:  Closed fracture of left hip, initial encounter [facility])    Rx / [place] Orders ED Discharge Orders    None       Varney Biles, MD 02/25/20 2105

## 2020-02-25 NOTE — H&P (Signed)
History and Physical  Kenneth Reeves [id number] DOB: [birth year] DOA: 02/25/2020  Referring physician: Varney Biles, MD PCP: Patient, No Pcp Per  Patient coming from: Home  Chief Complaint: Fall  HPI: Kenneth Reeves is a 70 y.o. male with medical history significant for hypertension and prostate cancer who presents to the emergency department due to fall. Patient states that he slipped off the stairs of the porch at home around 4 PM due to ice on the stairs and landed on his left side and sustaining a severe left-sided hip pain with difficulty in being able to bear weight. EMS was activated and patient was sent to the ED for further evaluation and management. He denies numbness, tingling, headache, nausea or vomiting.  ED Course: In the emergency department,. Work-up in the ED showed leukocytosis, hypokalemia, hyperglycemia Chest x-ray showed no active disease Left hip x-ray showed subcapital versus impacted transcervical left femoral neck fracture. He was treated with IV Dilaudid, Valium was given. Orthopedic surgery (Dr. Aline Brochure) was consulted and will see the patient in the morning Hospitalist was asked to admit patient for further evaluation and management.   Review of Systems: Constitutional: Negative for chills and fever.  HENT: Negative for ear pain and sore throat.   Eyes: Negative for pain and visual disturbance.  Respiratory: Negative for cough, chest tightness and shortness of breath.   Cardiovascular: Negative for chest pain and palpitations.  Gastrointestinal: Negative for abdominal pain and vomiting.  Endocrine: Negative for polyphagia and polyuria.  Genitourinary: Negative for decreased urine volume, dysuria, enuresis Musculoskeletal: Left hip pain.  Skin: Negative for color change and rash.  Allergic/Immunologic: Negative for immunocompromised state.  Neurological: Negative for tremors, syncope, speech difficulty, weakness, light-headedness and headaches.   Hematological: Does not bruise/bleed easily.  All other systems reviewed and are negative   Past Medical History:  Diagnosis Date  . Hypertension 2008  . Neuromuscular disorder ([place])    Bad knee   Past Surgical History:  Procedure Laterality Date  . COLONOSCOPY  02/22/2011   Procedure: COLONOSCOPY;  Surgeon: Jamesetta So, MD;  Location: [facility];  Service: Gastroenterology;  Laterality: N/A;  . TONSILLECTOMY    . VASECTOMY  1993    Social History:  reports that he has been smoking. He has been smoking about 2.00 packs per day. He does not have any smokeless tobacco history on file. He reports current alcohol use. He reports that he does not use drugs.   No Known Allergies  Family History  Problem Relation Age of Onset  . Heart attack Father      Prior to Admission medications   Medication Sig Start Date End Date Taking? Authorizing Provider  abiraterone acetate (ZYTIGA) 500 MG tablet Take 1,000 mg by mouth daily. 12/27/19  Yes [provider]  hydrochlorothiazide (HYDRODIURIL) 25 MG tablet Take 25 mg by mouth daily. 01/31/20  Yes [provider]  losartan (COZAAR) 100 MG tablet Take 100 mg by mouth daily. 01/31/20  Yes [provider]  predniSONE (DELTASONE) 5 MG tablet Take 5 mg by mouth daily with breakfast. 12/27/19  Yes [provider]  tamsulosin (FLOMAX) 0.4 MG CAPS capsule Take 0.4 mg by mouth daily. 11/04/19  Yes [provider]  losartan-hydrochlorothiazide (HYZAAR) 50-12.5 MG tablet Take 1 tablet by mouth daily. Patient not taking: No sig reported 02/06/18   Lestine Box, PA-C    Physical Exam: BP 111/76   Pulse 77   Temp 97.7 F (36.5 C) (Oral)   Resp 13  Ht 6' (1.829 m)   Wt 95.3 kg   SpO2 98%   BMI 28.48 kg/m   . General: 70 y.o. year-old male well developed well nourished in no acute distress.  Alert and oriented x3. Marland Kitchen HEENT: NCAT, EOMI . Neck: Supple, trachea medial . Cardiovascular: Regular  rate and rhythm with no rubs or gallops.  No thyromegaly or JVD noted.  No lower extremity edema. 2/4 pulses in all 4 extremities. Marland Kitchen Respiratory: Clear to auscultation with no wheezes or rales. Good inspiratory effort. . Abdomen: Soft nontender nondistended with normal bowel sounds x4 quadrants. . Muskuloskeletal: Tender to palpation of left hip. No cyanosis, clubbing or edema noted bilaterally . Neuro: CN II-XII intact, strength, sensation, reflexes . Skin: No ulcerative lesions noted or rashes . Psychiatry: Judgement and insight appear normal. Mood is appropriate for condition and setting          Labs on Admission:  Basic Metabolic Panel: Recent Labs  Lab 02/25/20 2114  NA 135  K 2.7*  CL 99  CO2 27  GLUCOSE 156*  BUN 12  CREATININE 1.08  CALCIUM 8.7*   Liver Function Tests: Recent Labs  Lab 02/25/20 2114  AST 24  ALT 26  ALKPHOS 72  BILITOT 0.7  PROT 6.4*  ALBUMIN 3.7   No results for input(s): LIPASE, AMYLASE in the last 168 hours. No results for input(s): AMMONIA in the last 168 hours. CBC: Recent Labs  Lab 02/25/20 2114  WBC 11.8*  NEUTROABS 10.0*  HGB 12.6*  HCT 37.2*  MCV 94.7  PLT 202   Cardiac Enzymes: No results for input(s): CKTOTAL, CKMB, CKMBINDEX, TROPONINI in the last 168 hours.  BNP (last 3 results) No results for input(s): BNP in the last 8760 hours.  ProBNP (last 3 results) No results for input(s): PROBNP in the last 8760 hours.  CBG: No results for input(s): GLUCAP in the last 168 hours.  Radiological Exams on Admission: DG Chest Port 1 View  Result Date: 02/25/2020 CLINICAL DATA:  Status post fall. EXAM: PORTABLE CHEST 1 VIEW COMPARISON:  None. FINDINGS: Mild, diffuse, chronic appearing increased lung markings are seen. There is no evidence of acute infiltrate, pleural effusion or pneumothorax. The heart size and mediastinal contours are within normal limits. The visualized skeletal structures are unremarkable. IMPRESSION: No active  disease. Electronically Signed   By: Virgina Norfolk M.D.   On: 02/25/2020 20:53   DG Hip Unilat W or Wo Pelvis 2-3 Views Left  Result Date: 02/25/2020 CLINICAL DATA:  fell 3 steps hitting his left hip. EXAM: DG HIP (WITH OR WITHOUT PELVIS) 2-3V LEFT COMPARISON:  X-ray pelvis 11/20/2013. FINDINGS: Subcapital versus impacted transcervical left femoral neck fracture. Frontal view of the pelvis and right hip are grossly unremarkable. There is no evidence of severe arthropathy or other focal bone abnormality. Subcutaneus soft tissue are grossly unremarkable. Vascular calcifications. IMPRESSION: Subcapital versus impacted transcervical left femoral neck fracture. Electronically Signed   By: Iven Finn M.D.   On: 02/25/2020 19:41    EKG: I independently viewed the EKG done and my findings are as followed: Normal sinus rhythm at rate of 84 bpm with QTc (499 ms)  Assessment/Plan Present on Admission: . Left displaced femoral neck fracture (HCC)  Principal Problem:   Left displaced femoral neck fracture (HCC) Active Problems:   Leukocytosis   Hypokalemia   Hyperglycemia   Prostate cancer ([place])   Essential hypertension  Acute left subcapital vs impacted transcervical left femoral neck fracture Left hip x-ray  showed subcapital versus impacted transcervical left femoral neck fracture. Continue Norco as needed for pain Continue fall precaution and neurochecks Continue PT/OT eval and treat Orthopedic surgery was consulted by ED physician and will see patient in the morning  Hypokalemia-no prolonged QTc (499 ms) K+ 2.7, this will be replenished Avoid QT prolonging drugs Magnesium level will be checked  Leukocytosis possibly reactive WBC 11.8; no acute concern for infectious process at this time Continue to monitor WBC with morning labs  Hyperglycemia possibly reactive CBG 156; patient has no history of T2DM Consider checking A1c if blood glucose continues to stay elevated  Essential  hypertension  Continue Hyzaar  Prostate cancer Continue Zytiga  BPH Continue Flomax   DVT prophylaxis: Heparin subcu (consider holding heparin once time for surgical intervention is indicated)  Code Status: Full code  Family Communication: None at bedside  Disposition Plan:  Patient is from:                        home Anticipated DC to:                   SNF or family members home Anticipated DC date:               2-3 days Anticipated DC barriers:           Patient is unstable to be discharged at this time due to left femoral neck fracture which require pending surgical intervention   Consults called: Orthopedic surgery  Admission status: Inpatient    Bernadette Hoit MD [facility]  02/26/2020, 3:13 AM

## 2020-02-25 NOTE — ED Triage Notes (Signed)
Pt arrived via [facility]. Pt fell while walking down steps. He fell 3 steps hitting his left hip.

## 2020-02-26 ENCOUNTER — Inpatient Hospital Stay (HOSPITAL_COMMUNITY): Payer: Medicare HMO

## 2020-02-26 DIAGNOSIS — I152 Hypertension secondary to endocrine disorders: Secondary | ICD-10-CM | POA: Insufficient documentation

## 2020-02-26 DIAGNOSIS — R9431 Abnormal electrocardiogram [ECG] [EKG]: Secondary | ICD-10-CM

## 2020-02-26 DIAGNOSIS — E876 Hypokalemia: Secondary | ICD-10-CM

## 2020-02-26 DIAGNOSIS — D72829 Elevated white blood cell count, unspecified: Secondary | ICD-10-CM

## 2020-02-26 DIAGNOSIS — R739 Hyperglycemia, unspecified: Secondary | ICD-10-CM

## 2020-02-26 DIAGNOSIS — E1159 Type 2 diabetes mellitus with other circulatory complications: Secondary | ICD-10-CM | POA: Insufficient documentation

## 2020-02-26 DIAGNOSIS — C61 Malignant neoplasm of prostate: Secondary | ICD-10-CM

## 2020-02-26 DIAGNOSIS — I10 Essential (primary) hypertension: Secondary | ICD-10-CM

## 2020-02-26 LAB — BASIC METABOLIC PANEL
Anion gap: 7 (ref 5–15)
BUN: 14 mg/dL (ref 8–23)
CO2: 26 mmol/L (ref 22–32)
Calcium: 8.1 mg/dL — ABNORMAL LOW (ref 8.9–10.3)
Chloride: 98 mmol/L (ref 98–111)
Creatinine, Ser: 0.91 mg/dL (ref 0.61–1.24)
GFR, Estimated: >60 mL/min (ref >60)
Glucose, Bld: 108 mg/dL — ABNORMAL HIGH (ref 70–99)
Potassium: 2.9 mmol/L — ABNORMAL LOW (ref 3.5–5.1)
Sodium: 131 mmol/L — ABNORMAL LOW (ref 135–145)

## 2020-02-26 LAB — APTT: aPTT: 28 seconds (ref 24–36)

## 2020-02-26 LAB — COMPREHENSIVE METABOLIC PANEL
ALT: 22 U/L (ref 0–44)
AST: 19 U/L (ref 15–41)
Albumin: 3.2 g/dL — ABNORMAL LOW (ref 3.5–5.0)
Alkaline Phosphatase: 63 U/L (ref 38–126)
Anion gap: 8 (ref 5–15)
BUN: 13 mg/dL (ref 8–23)
CO2: 26 mmol/L (ref 22–32)
Calcium: 8.2 mg/dL — ABNORMAL LOW (ref 8.9–10.3)
Chloride: 98 mmol/L (ref 98–111)
Creatinine, Ser: 0.9 mg/dL (ref 0.61–1.24)
GFR, Estimated: >60 mL/min (ref >60)
Glucose, Bld: 111 mg/dL — ABNORMAL HIGH (ref 70–99)
Potassium: 2.9 mmol/L — ABNORMAL LOW (ref 3.5–5.1)
Sodium: 132 mmol/L — ABNORMAL LOW (ref 135–145)
Total Bilirubin: 1.1 mg/dL (ref 0.3–1.2)
Total Protein: 5.7 g/dL — ABNORMAL LOW (ref 6.5–8.1)

## 2020-02-26 LAB — CBC
HCT: 33.7 % — ABNORMAL LOW (ref 39.0–52.0)
Hemoglobin: 11.6 g/dL — ABNORMAL LOW (ref 13.0–17.0)
MCH: 32.5 pg (ref 26.0–34.0)
MCHC: 34.4 g/dL (ref 30.0–36.0)
MCV: 94.4 fL (ref 80.0–100.0)
Platelets: 169 10*3/uL (ref 150–400)
RBC: 3.57 MIL/uL — ABNORMAL LOW (ref 4.22–5.81)
RDW: 12.4 % (ref 11.5–15.5)
WBC: 7.6 10*3/uL (ref 4.0–10.5)
nRBC: 0 % (ref 0.0–0.2)

## 2020-02-26 LAB — PROTIME-INR
INR: 1 (ref 0.8–1.2)
Prothrombin Time: 12.8 seconds (ref 11.4–15.2)

## 2020-02-26 LAB — SARS CORONAVIRUS 2 (TAT 6-24 HRS): SARS Coronavirus 2: NEGATIVE

## 2020-02-26 LAB — MAGNESIUM: Magnesium: 2.1 mg/dL (ref 1.7–2.4)

## 2020-02-26 LAB — PHOSPHORUS: Phosphorus: 3.3 mg/dL (ref 2.5–4.6)

## 2020-02-26 LAB — HIV ANTIBODY (ROUTINE TESTING W REFLEX): HIV Screen 4th Generation wRfx: NONREACTIVE

## 2020-02-26 MED ORDER — ABIRATERONE ACETATE 500 MG PO TABS: 1000.0000 mg | ORAL_TABLET | Freq: Every day | ORAL | Status: DC

## 2020-02-26 MED ORDER — POTASSIUM CHLORIDE 10 MEQ/100ML IV SOLN
10.0000 meq | INTRAVENOUS | Status: AC
  Administered 2020-02-26 (×2): 10 meq via INTRAVENOUS
  Filled 2020-02-26 (×2): qty 100

## 2020-02-26 MED ORDER — POTASSIUM CHLORIDE CRYS ER 20 MEQ PO TBCR
40.0000 meq | EXTENDED_RELEASE_TABLET | Freq: Once | ORAL | Status: AC
  Administered 2020-02-26: 40 meq via ORAL
  Filled 2020-02-26: qty 2

## 2020-02-26 MED ORDER — POTASSIUM CHLORIDE 20 MEQ PO PACK
40.0000 meq | PACK | ORAL | Status: AC
  Administered 2020-02-26 (×2): 40 meq via ORAL
  Filled 2020-02-26 (×2): qty 2

## 2020-02-26 MED ORDER — LOSARTAN POTASSIUM-HCTZ 50-12.5 MG PO TABS: 1.0000 | ORAL_TABLET | Freq: Every day | ORAL | Status: DC

## 2020-02-26 MED ORDER — HYDROCHLOROTHIAZIDE 12.5 MG PO CAPS
12.5000 mg | ORAL_CAPSULE | Freq: Every day | ORAL | Status: DC
  Administered 2020-02-28: 12.5 mg via ORAL
  Filled 2020-02-26 (×2): qty 1

## 2020-02-26 MED ORDER — PREDNISONE 10 MG PO TABS
5.0000 mg | ORAL_TABLET | Freq: Every day | ORAL | Status: DC
  Administered 2020-02-26 – 2020-02-28 (×2): 5 mg via ORAL
  Filled 2020-02-26 (×2): qty 1

## 2020-02-26 MED ORDER — HYDROMORPHONE HCL 1 MG/ML IJ SOLN
0.5000 mg | INTRAMUSCULAR | Status: DC | PRN
  Administered 2020-02-26 – 2020-02-27 (×4): 0.5 mg via INTRAVENOUS
  Filled 2020-02-26: qty 1
  Filled 2020-02-26: qty 0.5
  Filled 2020-02-26: qty 1
  Filled 2020-02-26 (×2): qty 0.5

## 2020-02-26 MED ORDER — CEFAZOLIN SODIUM-DEXTROSE 2-4 GM/100ML-% IV SOLN
2.0000 g | INTRAVENOUS | Status: AC
  Filled 2020-02-26: qty 100

## 2020-02-26 MED ORDER — LOSARTAN POTASSIUM 50 MG PO TABS
50.0000 mg | ORAL_TABLET | Freq: Every day | ORAL | Status: DC
  Administered 2020-02-28: 50 mg via ORAL
  Filled 2020-02-26 (×2): qty 1
  Filled 2020-02-26: qty 2

## 2020-02-26 MED ORDER — TAMSULOSIN HCL 0.4 MG PO CAPS
0.4000 mg | ORAL_CAPSULE | Freq: Every day | ORAL | Status: DC
  Administered 2020-02-26 – 2020-02-27 (×2): 0.4 mg via ORAL
  Filled 2020-02-26 (×4): qty 1

## 2020-02-26 NOTE — Anesthesia Preprocedure Evaluation (Addendum)
Anesthesia Evaluation  Patient identified by MRN, date of birth, ID band Patient awake    Reviewed: Allergy & Precautions, NPO status , Patient's Chart, lab work & pertinent test results  History of Anesthesia Complications Negative for: history of anesthetic complications  Airway Mallampati: II  TM Distance: >3 FB Neck ROM: Full    Dental  (+) Dental Advisory Given, Upper Dentures, Missing   Pulmonary shortness of breath and with exertion, Current Smoker and Patient abstained from smoking.,    Pulmonary exam normal breath sounds clear to auscultation       Cardiovascular Exercise Tolerance: Poor hypertension, Pt. on medications Normal cardiovascular exam Rhythm:Regular Rate:Normal  25-Feb-2020 20:37:26 [facility] ROUTINE RECORD Sinus rhythm Probable left atrial enlargement Nonspecific intraventricular conduction delay Anteroseptal infarct, age indeterminate No acute changes No significant change since last tracing Confirmed by Varney Biles [phone]) on 02/25/2020 8:46:25 PM   Neuro/Psych  Neuromuscular disease negative psych ROS   GI/Hepatic GERD (had GERD when he was on chemo)  ,(+)     substance abuse  alcohol use,   Endo/Other  negative endocrine ROS  Renal/GU Renal disease (Hypokalemia )     Musculoskeletal negative musculoskeletal ROS (+)   Abdominal   Peds  Hematology negative hematology ROS (+)   Anesthesia Other Findings Hypokalemia  Prolonged QT  Reproductive/Obstetrics negative OB ROS                            Anesthesia Physical Anesthesia Plan  ASA: III  Anesthesia Plan: General   Post-op Pain Management:    Induction: Intravenous  PONV Risk Score and Plan: Ondansetron  Airway Management Planned: LMA  Additional Equipment:   Intra-op Plan:   Post-operative Plan: Extubation in OR  Informed Consent: I have reviewed the patients  History and Physical, chart, labs and discussed the procedure including the risks, benefits and alternatives for the proposed anesthesia with the patient or authorized representative who has indicated his/her understanding and acceptance.     Dental advisory given  Plan Discussed with: CRNA and Surgeon  Anesthesia Plan Comments: (. )      Anesthesia Quick Evaluation

## 2020-02-26 NOTE — Progress Notes (Signed)
PROGRESS NOTE    Kenneth Reeves  [id number] DOB: 03-16-1950 DOA: 02/25/2020 PCP: Patient, No Pcp Per   Brief Narrative:  HPI: Kenneth Reeves is a 70 y.o. male with medical history significant for hypertension and prostate cancer who presents to the emergency department due to fall. Patient states that he slipped off the stairs of the porch at home around 4 PM due to ice on the stairs and landed on his left side and sustaining a severe left-sided hip pain with difficulty in being able to bear weight. EMS was activated and patient was sent to the ED for further evaluation and management. He denies numbness, tingling, headache, nausea or vomiting.  ED Course: In the emergency department,. Work-up in the ED showed leukocytosis, hypokalemia, hyperglycemia Chest x-ray showed no active disease Left hip x-ray showed subcapital versus impacted transcervical left femoral neck fracture. He was treated with IV Dilaudid, Valium was given. Orthopedic surgery (Dr. Aline Brochure) was consulted and will see the patient in the morning Hospitalist was asked to admit patient for further evaluation and management.   Assessment & Plan:   Principal Problem:   Left displaced femoral neck fracture (HCC) Active Problems:   Leukocytosis   Hypokalemia   Hyperglycemia   Prostate cancer (HCC)   Essential hypertension   Prolonged QT interval   Acute left subcapital vs impacted transcervical left femoral neck fracture Left hip x-ray showed subcapital versus impacted transcervical left femoral neck fracture. Continue Norco as needed for pain.  Orthopedic on board.  I was informed by RN that patient's surgical repair has been postponed till tomorrow due to hypokalemia.  Defer management to orthopedics.  Hypokalemia-no prolonged QTc (499 ms) K+ 2.7 which reportedly was replenished but his potassium is still 2.9.  I have placed orders for potassium chloride p.o. 40 mEq x 2 doses.  Repeat labs in the morning.  Monitor on  telemetry.  Leukocytosis: possibly reactive Which has resolved.  Hyperglycemia possibly reactive CBG 156; patient has no history of T2DM.  Blood sugar has been fairly normal since then.  Essential hypertension: Controlled Continue Hyzaar  Prostate cancer Continue Zytiga  BPH Continue Flomax   DVT prophylaxis: heparin injection 5,000 Units Start: 02/25/20 2315 SCDs Start: 02/25/20 2311   Code Status: Full Code  Family Communication:  None present at bedside.  Plan of care discussed with patient in length and he verbalized understanding and agreed with it.  Status is: Inpatient  Remains inpatient appropriate because:Inpatient level of care appropriate due to severity of illness   Dispo: The patient is from: Home              Anticipated d/c is to: SNF              Anticipated d/c date is: 3 days              Patient currently is not medically stable to d/c.        Estimated body mass index is 28.48 kg/m as calculated from the following:   Height as of this encounter: 6' (1.829 m).   Weight as of this encounter: 95.3 kg.      Nutritional status:               Consultants:   Orthopedics  Procedures:   None  Antimicrobials:  Anti-infectives (From admission, onward)   None         Subjective: Patient seen and examined.  Complains of hip pain.  No other complaint.  Objective: Vitals:  02/26/20 0830 02/26/20 0900 02/26/20 0930 02/26/20 1000  BP: 117/76 121/78 123/80 125/87  Pulse: 70 72 71 71  Resp: 15 15 14 16   Temp:      TempSrc:      SpO2: 100% 100% 100% 100%  Weight:      Height:       No intake or output data in the 24 hours ending 02/26/20 1212 Filed Weights   02/25/20 1754  Weight: 95.3 kg    Examination:  General exam: Appears calm and comfortable  Respiratory system: Clear to auscultation. Respiratory effort normal. Cardiovascular system: S1 & S2 heard, RRR. No JVD, murmurs, rubs, gallops or clicks. No pedal  edema. Gastrointestinal system: Abdomen is nondistended, soft and nontender. No organomegaly or masses felt. Normal bowel sounds heard. Central nervous system: Alert and oriented. No focal neurological deficits. Skin: No rashes, lesions or ulcers Psychiatry: Judgement and insight appear normal. Mood & affect appropriate.    Data Reviewed: I have personally reviewed following labs and imaging studies  CBC: Recent Labs  Lab 02/25/20 2114 02/26/20 0850  WBC 11.8* 7.6  NEUTROABS 10.0*  --   HGB 12.6* 11.6*  HCT 37.2* 33.7*  MCV 94.7 94.4  PLT 202 703   Basic Metabolic Panel: Recent Labs  Lab 02/25/20 2114 02/26/20 0850  NA 135 132*  131*  K 2.7* 2.9*  2.9*  CL 99 98  98  CO2 27 26  26   GLUCOSE 156* 111*  108*  BUN 12 13  14   CREATININE 1.08 0.90  0.91  CALCIUM 8.7* 8.2*  8.1*  MG  --  2.1  PHOS  --  3.3   GFR: Estimated Creatinine Clearance: 95.4 mL/min (by C-G formula based on SCr of 0.9 mg/dL). Liver Function Tests: Recent Labs  Lab 02/25/20 2114 02/26/20 0850  AST 24 19  ALT 26 22  ALKPHOS 72 63  BILITOT 0.7 1.1  PROT 6.4* 5.7*  ALBUMIN 3.7 3.2*   No results for input(s): LIPASE, AMYLASE in the last 168 hours. No results for input(s): AMMONIA in the last 168 hours. Coagulation Profile: Recent Labs  Lab 02/25/20 2114 02/26/20 0850  INR 0.9 1.0   Cardiac Enzymes: No results for input(s): CKTOTAL, CKMB, CKMBINDEX, TROPONINI in the last 168 hours. BNP (last 3 results) No results for input(s): PROBNP in the last 8760 hours. HbA1C: No results for input(s): HGBA1C in the last 72 hours. CBG: No results for input(s): GLUCAP in the last 168 hours. Lipid Profile: No results for input(s): CHOL, HDL, LDLCALC, TRIG, CHOLHDL, LDLDIRECT in the last 72 hours. Thyroid Function Tests: No results for input(s): TSH, T4TOTAL, FREET4, T3FREE, THYROIDAB in the last 72 hours. Anemia Panel: No results for input(s): VITAMINB12, FOLATE, FERRITIN, TIBC, IRON,  RETICCTPCT in the last 72 hours. Sepsis Labs: No results for input(s): PROCALCITON, LATICACIDVEN in the last 168 hours.  Recent Results (from the past 240 hour(s))  SARS CORONAVIRUS 2 (TAT 6-24 HRS) Nasopharyngeal Nasopharyngeal Swab     Status: None   Collection Time: 02/25/20  8:44 PM   Specimen: Nasopharyngeal Swab  Result Value Ref Range Status   SARS Coronavirus 2 NEGATIVE NEGATIVE Final    Comment: (NOTE) SARS-CoV-2 target nucleic acids are NOT DETECTED.  The SARS-CoV-2 RNA is generally detectable in upper and lower respiratory specimens during the acute phase of infection. Negative results do not preclude SARS-CoV-2 infection, do not rule out co-infections with other pathogens, and should not be used as the sole basis for treatment or  other patient management decisions. Negative results must be combined with clinical observations, patient history, and epidemiological information. The expected result is Negative.  Fact Sheet for Patients: SugarRoll.be  Fact Sheet for Healthcare Providers: https://www.woods-mathews.com/  This test is not yet approved or cleared by the [place] FDA and  has been authorized for detection and/or diagnosis of SARS-CoV-2 by FDA under an Emergency Use Authorization (EUA). This EUA will remain  in effect (meaning this test can be used) for the duration of the COVID-19 declaration under Se ction 564(b)(1) of the Act, 21 U.S.C. section 360bbb-3(b)(1), unless the authorization is terminated or revoked sooner.  Performed at [facility], [place] [address] [place], [place] [address]       Radiology Studies: DG Chest Port 1 View  Result Date: 02/25/2020 CLINICAL DATA:  Status post fall. EXAM: PORTABLE CHEST 1 VIEW COMPARISON:  None. FINDINGS: Mild, diffuse, chronic appearing increased lung markings are seen. There is no evidence of acute infiltrate, pleural effusion or pneumothorax. The heart size  and mediastinal contours are within normal limits. The visualized skeletal structures are unremarkable. IMPRESSION: No active disease. Electronically Signed   By: Virgina Norfolk M.D.   On: 02/25/2020 20:53   DG Humerus Left  Result Date: 02/26/2020 CLINICAL DATA:  Fall. EXAM: LEFT HUMERUS - 2+ VIEW COMPARISON:  No recent prior. FINDINGS: No acute bony or joint abnormality. No evidence of fracture or dislocation. IMPRESSION: No acute abnormality. Electronically Signed   By: Marcello Moores  Register   On: 02/26/2020 07:44   DG Hip Unilat W or Wo Pelvis 2-3 Views Left  Result Date: 02/25/2020 CLINICAL DATA:  fell 3 steps hitting his left hip. EXAM: DG HIP (WITH OR WITHOUT PELVIS) 2-3V LEFT COMPARISON:  X-ray pelvis 11/20/2013. FINDINGS: Subcapital versus impacted transcervical left femoral neck fracture. Frontal view of the pelvis and right hip are grossly unremarkable. There is no evidence of severe arthropathy or other focal bone abnormality. Subcutaneus soft tissue are grossly unremarkable. Vascular calcifications. IMPRESSION: Subcapital versus impacted transcervical left femoral neck fracture. Electronically Signed   By: Iven Finn M.D.   On: 02/25/2020 19:41    Scheduled Meds: . abiraterone acetate  1,000 mg Oral Daily  . heparin  5,000 Units Subcutaneous Q8H  . losartan  50 mg Oral Daily   And  . hydrochlorothiazide  12.5 mg Oral Daily  . potassium chloride  40 mEq Oral Q4H  . predniSONE  5 mg Oral Q breakfast  . tamsulosin  0.4 mg Oral Daily   Continuous Infusions:   LOS: 1 day   Time spent: 33 minutes   Darliss Cheney, MD [facility]  02/26/2020, 12:12 PM   To contact the attending provider between 7A-7P or the covering provider during after hours 7P-7A, please log into the web site [email or web address].

## 2020-02-26 NOTE — Consult Note (Signed)
ORTHOPAEDIC CONSULTATION  REQUESTING PHYSICIAN: Darliss Cheney, MD  ASSESSMENT AND PLAN: 70 y.o. male with the following: Left Hip Valgus impacted femoral neck fracture  This patient requires inpatient admission to the hospitalist, to include preoperative clearance and perioperative medical management  - Weight Bearing Status/Activity: NWB Left lower extremity  - Additional recommended labs/tests: Preop Labs: CBC, BMP, PT/INR, Chest XR and EKG  -VTE Prophylaxis: Please hold prior to OR; to resume POD#1 at the discretion of the primary team  - Pain control: Recommend PO pain medications PRN; judicious use of narcotics  - Follow-up plan: F/u 10-14 days postop  -Procedures: Plan for OR once patient has been medically optimized  Plan for Left hip CRPP   DOS: 02/27/20  Please keep NPO after midnight  Please hold anticoagulation the morning of the procedure     Chief Complaint: Left hip pain  HPI: Kenneth Reeves is a 70 y.o. male who presented to the ED for evaluation after sustaining a mechanical fall yesterday afternoon.  He slipped on some ice on his porch, and landed on his left side.  He did not hit his head.  He noted immediate pain and inability to walk.  His pain has improved with pain medication.  It is still painful if he moves his leg or it gets bumped.  No pain radiating distally.  He denies numbness and tingling.    Past Medical History:  Diagnosis Date  . Hypertension 2008  . 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 COLONOSCOPY  02/22/2011   Procedure: COLONOSCOPY;  Surgeon: Jamesetta So, MD;  Location: [facility];  Service: Gastroenterology;  Laterality: N/A;  . TONSILLECTOMY    . VASECTOMY  1993   Social History   Socioeconomic History  . Marital status: Married    Spouse name: Not on file  . Number of children: Not on file  . Years of education: Not on file  . Highest education level: Not on  file  Occupational History  . Not on file  Tobacco Use  . Smoking status: Current Every Day Smoker    Packs/day: 2.00  . Smokeless tobacco: Not on file  Vaping Use  . Vaping Use: Not on file  Substance and Sexual Activity  . Alcohol use: Yes    Comment: few beers per day.  . Drug use: No  . Sexual activity: Not on file  Other Topics Concern  . Not on file  Social History Narrative  . Not on file   Social Determinants of Health   Financial Resource Strain: Not on file  Food Insecurity: Not on file  Transportation Needs: Not on file  Physical Activity: Not on file  Stress: Not on file  Social Connections: Not on file   Family History  Problem Relation Age of Onset  . Heart attack Father    No Known Allergies Prior to Admission medications   Medication Sig Start Date End Date Taking? Authorizing Provider  abiraterone acetate (ZYTIGA) 500 MG tablet Take 1,000 mg by mouth daily. 12/27/19  Yes [provider]  hydrochlorothiazide (HYDRODIURIL) 25 MG tablet Take 25 mg by mouth daily. 01/31/20  Yes [provider]  losartan (COZAAR) 100 MG tablet Take 100 mg by mouth daily. 01/31/20  Yes [provider]  predniSONE (DELTASONE) 5 MG tablet Take 5 mg by mouth daily with breakfast. 12/27/19  Yes [provider]  tamsulosin (FLOMAX) 0.4 MG CAPS  capsule Take 0.4 mg by mouth daily. 11/04/19  Yes [provider]  losartan-hydrochlorothiazide (HYZAAR) 50-12.5 MG tablet Take 1 tablet by mouth daily. Patient not taking: No sig reported 02/06/18   Lestine Box, PA-C   DG Chest [place] 1 View  Result Date: 02/25/2020 CLINICAL DATA:  Status post fall. EXAM: PORTABLE CHEST 1 VIEW COMPARISON:  None. FINDINGS: Mild, diffuse, chronic appearing increased lung markings are seen. There is no evidence of acute infiltrate, pleural effusion or pneumothorax. The heart size and mediastinal contours are within normal limits. The visualized skeletal structures  are unremarkable. IMPRESSION: No active disease. Electronically Signed   By: Virgina Norfolk M.D.   On: 02/25/2020 20:53   DG Humerus Left  Result Date: 02/26/2020 CLINICAL DATA:  Fall. EXAM: LEFT HUMERUS - 2+ VIEW COMPARISON:  No recent prior. FINDINGS: No acute bony or joint abnormality. No evidence of fracture or dislocation. IMPRESSION: No acute abnormality. Electronically Signed   By: Marcello Moores  Register   On: 02/26/2020 07:44   DG Hip Unilat W or Wo Pelvis 2-3 Views Left  Result Date: 02/25/2020 CLINICAL DATA:  fell 3 steps hitting his left hip. EXAM: DG HIP (WITH OR WITHOUT PELVIS) 2-3V LEFT COMPARISON:  X-ray pelvis 11/20/2013. FINDINGS: Subcapital versus impacted transcervical left femoral neck fracture. Frontal view of the pelvis and right hip are grossly unremarkable. There is no evidence of severe arthropathy or other focal bone abnormality. Subcutaneus soft tissue are grossly unremarkable. Vascular calcifications. IMPRESSION: Subcapital versus impacted transcervical left femoral neck fracture. Electronically Signed   By: Iven Finn M.D.   On: 02/25/2020 19:41   Family History Reviewed and non-contributory, no pertinent history of problems with bleeding or anesthesia    Review of Systems No fevers or chills No numbness or tingling No chest pain No shortness of breath No bowel or bladder dysfunction No GI distress No headaches    OBJECTIVE  Vitals: Patient Vitals for the past 8 hrs:  BP Pulse Resp SpO2  02/26/20 1330 139/76 82 15 98 %  02/26/20 1300 (!) 145/86 72 19 100 %  02/26/20 1000 125/87 71 16 100 %  02/26/20 0930 123/80 71 14 100 %  02/26/20 0900 121/78 72 15 100 %  02/26/20 0830 117/76 70 15 100 %  02/26/20 0800 132/74 79 13 100 %  02/26/20 0700 (!) 102/91 79 15 100 %   General: Alert, no acute distress Cardiovascular: Extremities are warm Respiratory: No cyanosis, no use of accessory musculature Skin: No lesions in the area of chief complaint   Neurologic: Sensation intact distally  Psychiatric: Patient is competent for consent with normal mood and affect Lymphatic: No swelling obvious and reported other than the area involved in the exam below Extremities  LLE: Extremity held in a fixed position.  ROM deferred due to known fracture.  Sensation is intact distally in the sural, saphenous, DP, SP, and plantar nerve distribution. 2+ DP pulse.  Toes are WWP.  Active motion intact in the TA/EHL/GS. RLE: Sensation is intact distally in the sural, saphenous, DP, SP, and plantar nerve distribution. 2+ DP pulse.  Toes are WWP.  Active motion intact in the TA/EHL/GS. Tolerates gentle ROM of the hip.  No pain with axial loading.     Test Results Imaging XR of the Left hip demonstrates a nondisplaced femoral neck fracture.  Labs cbc Recent Labs    02/25/20 2114 02/26/20 0850  WBC 11.8* 7.6  HGB 12.6* 11.6*  HCT 37.2* 33.7*  PLT 202 169  Labs inflam No results for input(s): CRP in the last 72 hours.  Invalid input(s): ESR  Labs coag Recent Labs    02/25/20 2114 02/26/20 0850  INR 0.9 1.0    Recent Labs    02/25/20 2114 02/26/20 0850  NA 135 132*  131*  K 2.7* 2.9*  2.9*  CL 99 98  98  CO2 27 26  26   GLUCOSE 156* 111*  108*  BUN 12 13  14   CREATININE 1.08 0.90  0.91  CALCIUM 8.7* 8.2*  8.1*

## 2020-02-26 NOTE — ED Notes (Signed)
Discussed with pt his status of NPO.  Pt states he last had 1/4 gingerale  at 6am.  Also states he took his home medicine this morning with a sip of gingerale.  Instructed pt not to take anymore of his home medications or to take anything else by mouth.  Pt verbalized understanding

## 2020-02-26 NOTE — Progress Notes (Signed)
OT Cancellation Note  Patient Details Name: Kenneth Reeves MRN: [id number] DOB: April 06, 1950   Cancelled Treatment:    Reason Eval/Treat Not Completed: Patient not medically ready. Orthopedic MD planning for Left hip CRPP tomorrow. Pt will need a new order for OT services when appropriate.    Guadelupe Sabin, OTR/L  [phone] 02/26/2020, 4:33 PM

## 2020-02-27 ENCOUNTER — Encounter (HOSPITAL_COMMUNITY): Payer: Self-pay | Admitting: Internal Medicine

## 2020-02-27 ENCOUNTER — Inpatient Hospital Stay (HOSPITAL_COMMUNITY): Payer: Medicare HMO | Admitting: Anesthesiology

## 2020-02-27 ENCOUNTER — Inpatient Hospital Stay (HOSPITAL_COMMUNITY): Payer: Medicare HMO

## 2020-02-27 ENCOUNTER — Encounter (HOSPITAL_COMMUNITY)
Admission: EM | Disposition: A | Discharge status: Home Health | Payer: Self-pay | Source: Home / Self Care | Attending: Family Medicine

## 2020-02-27 DIAGNOSIS — S72002A Fracture of unspecified part of neck of left femur, initial encounter for closed fracture: Secondary | ICD-10-CM | POA: Diagnosis not present

## 2020-02-27 HISTORY — PX: HIP PINNING,CANNULATED: SHX1758

## 2020-02-27 LAB — CBC WITH DIFFERENTIAL/PLATELET
Abs Immature Granulocytes: 0.02 10*3/uL (ref 0.00–0.07)
Basophils Absolute: 0.1 10*3/uL (ref 0.0–0.1)
Basophils Relative: 1 %
Eosinophils Absolute: 0.2 10*3/uL (ref 0.0–0.5)
Eosinophils Relative: 3 %
HCT: 33 % — ABNORMAL LOW (ref 39.0–52.0)
Hemoglobin: 11.3 g/dL — ABNORMAL LOW (ref 13.0–17.0)
Immature Granulocytes: 0 %
Lymphocytes Relative: 24 %
Lymphs Abs: 1.6 10*3/uL (ref 0.7–4.0)
MCH: 32.6 pg (ref 26.0–34.0)
MCHC: 34.2 g/dL (ref 30.0–36.0)
MCV: 95.1 fL (ref 80.0–100.0)
Monocytes Absolute: 0.5 10*3/uL (ref 0.1–1.0)
Monocytes Relative: 8 %
Neutro Abs: 4.2 10*3/uL (ref 1.7–7.7)
Neutrophils Relative %: 64 %
Platelets: 150 10*3/uL (ref 150–400)
RBC: 3.47 MIL/uL — ABNORMAL LOW (ref 4.22–5.81)
RDW: 12.5 % (ref 11.5–15.5)
WBC: 6.5 10*3/uL (ref 4.0–10.5)
nRBC: 0 % (ref 0.0–0.2)

## 2020-02-27 LAB — BASIC METABOLIC PANEL
Anion gap: 7 (ref 5–15)
BUN: 10 mg/dL (ref 8–23)
CO2: 26 mmol/L (ref 22–32)
Calcium: 8.2 mg/dL — ABNORMAL LOW (ref 8.9–10.3)
Chloride: 99 mmol/L (ref 98–111)
Creatinine, Ser: 0.89 mg/dL (ref 0.61–1.24)
GFR, Estimated: >60 mL/min (ref >60)
Glucose, Bld: 102 mg/dL — ABNORMAL HIGH (ref 70–99)
Potassium: 3.5 mmol/L (ref 3.5–5.1)
Sodium: 132 mmol/L — ABNORMAL LOW (ref 135–145)

## 2020-02-27 LAB — TYPE AND SCREEN
ABO/RH(D): A NEG
Antibody Screen: NEGATIVE

## 2020-02-27 LAB — MRSA PCR SCREENING: MRSA by PCR: NEGATIVE

## 2020-02-27 SURGERY — FIXATION, FEMUR, NECK, PERCUTANEOUS, USING SCREW
Anesthesia: General | Site: Hip | Laterality: Left

## 2020-02-27 MED ORDER — CEFAZOLIN SODIUM-DEXTROSE 1-4 GM/50ML-% IV SOLN
1.0000 g | Freq: Three times a day (TID) | INTRAVENOUS | Status: AC
  Administered 2020-02-27 – 2020-02-28 (×3): 1 g via INTRAVENOUS
  Filled 2020-02-27 (×3): qty 50

## 2020-02-27 MED ORDER — BUPIVACAINE HCL (PF) 0.5 % IJ SOLN
INTRAMUSCULAR | Status: DC | PRN
  Administered 2020-02-27: 30 mL

## 2020-02-27 MED ORDER — PROCHLORPERAZINE EDISYLATE 10 MG/2ML IJ SOLN
5.0000 mg | INTRAMUSCULAR | Status: DC | PRN
  Administered 2020-02-27: 5 mg via INTRAVENOUS
  Filled 2020-02-27: qty 2

## 2020-02-27 MED ORDER — CHLORHEXIDINE GLUCONATE 4 % EX LIQD: 60.0000 mL | Freq: Once | CUTANEOUS | Status: DC

## 2020-02-27 MED ORDER — PHENYLEPHRINE 40 MCG/ML (10ML) SYRINGE FOR IV PUSH (FOR BLOOD PRESSURE SUPPORT)
PREFILLED_SYRINGE | INTRAVENOUS | Status: AC
  Filled 2020-02-27: qty 10

## 2020-02-27 MED ORDER — GLYCOPYRROLATE PF 0.2 MG/ML IJ SOSY
PREFILLED_SYRINGE | INTRAMUSCULAR | Status: DC | PRN
  Administered 2020-02-27: .1 mg via INTRAVENOUS

## 2020-02-27 MED ORDER — PROPOFOL 10 MG/ML IV BOLUS
INTRAVENOUS | Status: AC
  Filled 2020-02-27: qty 20

## 2020-02-27 MED ORDER — DEXAMETHASONE SODIUM PHOSPHATE 10 MG/ML IJ SOLN
INTRAMUSCULAR | Status: AC
  Filled 2020-02-27: qty 1

## 2020-02-27 MED ORDER — PHENYLEPHRINE 40 MCG/ML (10ML) SYRINGE FOR IV PUSH (FOR BLOOD PRESSURE SUPPORT)
PREFILLED_SYRINGE | INTRAVENOUS | Status: DC | PRN
  Administered 2020-02-27 (×5): 80 ug via INTRAVENOUS

## 2020-02-27 MED ORDER — LIDOCAINE HCL (PF) 2 % IJ SOLN
INTRAMUSCULAR | Status: AC
  Filled 2020-02-27: qty 5

## 2020-02-27 MED ORDER — GLYCOPYRROLATE PF 0.2 MG/ML IJ SOSY
PREFILLED_SYRINGE | INTRAMUSCULAR | Status: AC
  Filled 2020-02-27: qty 1

## 2020-02-27 MED ORDER — BUPIVACAINE HCL (PF) 0.5 % IJ SOLN
INTRAMUSCULAR | Status: AC
  Filled 2020-02-27: qty 30

## 2020-02-27 MED ORDER — HYDROMORPHONE HCL 1 MG/ML IJ SOLN: 0.2500 mg | INTRAMUSCULAR | Status: DC | PRN

## 2020-02-27 MED ORDER — CHLORHEXIDINE GLUCONATE 0.12 % MT SOLN
OROMUCOSAL | Status: AC
  Filled 2020-02-27: qty 15

## 2020-02-27 MED ORDER — LACTATED RINGERS IV SOLN
INTRAVENOUS | Status: DC
  Administered 2020-02-27: 1000 mL via INTRAVENOUS

## 2020-02-27 MED ORDER — POVIDONE-IODINE 10 % EX SWAB: 2.0000 "application " | Freq: Once | CUTANEOUS | Status: DC

## 2020-02-27 MED ORDER — PROPOFOL 10 MG/ML IV BOLUS
INTRAVENOUS | Status: DC | PRN
  Administered 2020-02-27: 150 mg via INTRAVENOUS

## 2020-02-27 MED ORDER — ONDANSETRON HCL 4 MG/2ML IJ SOLN
INTRAMUSCULAR | Status: AC
  Filled 2020-02-27: qty 2

## 2020-02-27 MED ORDER — EPHEDRINE 5 MG/ML INJ
INTRAVENOUS | Status: AC
  Filled 2020-02-27: qty 10

## 2020-02-27 MED ORDER — PHENYLEPHRINE HCL-NACL 10-0.9 MG/250ML-% IV SOLN
INTRAVENOUS | Status: DC | PRN
  Administered 2020-02-27: 60 ug/min via INTRAVENOUS

## 2020-02-27 MED ORDER — MEPERIDINE HCL 50 MG/ML IJ SOLN: 6.2500 mg | INTRAMUSCULAR | Status: DC | PRN

## 2020-02-27 MED ORDER — MIDAZOLAM HCL 2 MG/2ML IJ SOLN
INTRAMUSCULAR | Status: AC
  Filled 2020-02-27: qty 2

## 2020-02-27 MED ORDER — CHLORHEXIDINE GLUCONATE 0.12 % MT SOLN
15.0000 mL | Freq: Once | OROMUCOSAL | Status: AC
  Administered 2020-02-27: 15 mL via OROMUCOSAL

## 2020-02-27 MED ORDER — ALUM & MAG HYDROXIDE-SIMETH 200-200-20 MG/5ML PO SUSP
30.0000 mL | ORAL | Status: DC | PRN
  Administered 2020-02-27: 30 mL via ORAL
  Filled 2020-02-27: qty 30

## 2020-02-27 MED ORDER — ABIRATERONE ACETATE 500 MG PO TABS
1000.0000 mg | ORAL_TABLET | Freq: Every day | ORAL | Status: DC
  Administered 2020-02-27: 1000 mg via ORAL
  Filled 2020-02-27: qty 2

## 2020-02-27 MED ORDER — FENTANYL CITRATE (PF) 250 MCG/5ML IJ SOLN
INTRAMUSCULAR | Status: AC
  Filled 2020-02-27: qty 5

## 2020-02-27 MED ORDER — DEXAMETHASONE SODIUM PHOSPHATE 4 MG/ML IJ SOLN
INTRAMUSCULAR | Status: DC | PRN
  Administered 2020-02-27: 8 mg via INTRAVENOUS

## 2020-02-27 MED ORDER — EPHEDRINE SULFATE-NACL 50-0.9 MG/10ML-% IV SOSY
PREFILLED_SYRINGE | INTRAVENOUS | Status: DC | PRN
  Administered 2020-02-27 (×4): 5 mg via INTRAVENOUS

## 2020-02-27 MED ORDER — HEPARIN SODIUM (PORCINE) 5000 UNIT/ML IJ SOLN
5000.0000 [IU] | Freq: Three times a day (TID) | INTRAMUSCULAR | Status: DC
  Administered 2020-02-27 – 2020-02-28 (×2): 5000 [IU] via SUBCUTANEOUS
  Filled 2020-02-27 (×2): qty 1

## 2020-02-27 MED ORDER — LIDOCAINE 2% (20 MG/ML) 5 ML SYRINGE
INTRAMUSCULAR | Status: DC | PRN
  Administered 2020-02-27: 100 mg via INTRAVENOUS

## 2020-02-27 MED ORDER — FENTANYL CITRATE (PF) 100 MCG/2ML IJ SOLN
INTRAMUSCULAR | Status: DC | PRN
  Administered 2020-02-27 (×4): 50 ug via INTRAVENOUS

## 2020-02-27 MED ORDER — ORAL CARE MOUTH RINSE: 15.0000 mL | Freq: Once | OROMUCOSAL | Status: AC

## 2020-02-27 MED ORDER — ONDANSETRON HCL 4 MG/2ML IJ SOLN
INTRAMUSCULAR | Status: DC | PRN
  Administered 2020-02-27: 4 mg via INTRAVENOUS

## 2020-02-27 MED ORDER — MIDAZOLAM HCL 5 MG/5ML IJ SOLN
INTRAMUSCULAR | Status: DC | PRN
  Administered 2020-02-27: 2 mg via INTRAVENOUS

## 2020-02-27 MED ORDER — CEFAZOLIN SODIUM-DEXTROSE 2-4 GM/100ML-% IV SOLN
2.0000 g | INTRAVENOUS | Status: AC
  Administered 2020-02-27: 2 g via INTRAVENOUS

## 2020-02-27 MED ORDER — 0.9 % SODIUM CHLORIDE (POUR BTL) OPTIME
TOPICAL | Status: DC | PRN
  Administered 2020-02-27: 1000 mL

## 2020-02-27 SURGICAL SUPPLY — 55 items
APL PRP STRL LF DISP 70% ISPRP (MISCELLANEOUS) ×1
BAG HAMPER (MISCELLANEOUS) ×2 IMPLANT
BIT DRILL 4.8X300 (BIT) ×2 IMPLANT
BIT DRILL 5.0 CALIBRATED STEP (BIT) ×1 IMPLANT
BLADE SURG SZ10 CARB STEEL (BLADE) ×4 IMPLANT
BNDG GAUZE ELAST 4 BULKY (GAUZE/BANDAGES/DRESSINGS) ×2 IMPLANT
CHLORAPREP W/TINT 26 (MISCELLANEOUS) ×2 IMPLANT
CLOTH BEACON ORANGE TIMEOUT ST (SAFETY) ×2 IMPLANT
CLSR STERI-STRIP ANTIMIC 1/2X4 (GAUZE/BANDAGES/DRESSINGS) ×2 IMPLANT
COVER LIGHT HANDLE STERIS (MISCELLANEOUS) ×4 IMPLANT
COVER MAYO STAND XLG (MISCELLANEOUS) IMPLANT
COVER WAND RF STERILE (DRAPES) ×2 IMPLANT
DECANTER SPIKE VIAL GLASS SM (MISCELLANEOUS) ×4 IMPLANT
DRAPE STERI IOBAN 125X83 (DRAPES) ×2 IMPLANT
DRSG MEPILEX SACRM 8.7X9.8 (GAUZE/BANDAGES/DRESSINGS) ×2 IMPLANT
DRSG TEGADERM 4X4.75 (GAUZE/BANDAGES/DRESSINGS) ×3 IMPLANT
GAUZE SPONGE 4X4 12PLY STRL (GAUZE/BANDAGES/DRESSINGS) ×2 IMPLANT
GLOVE BIOGEL PI IND STRL 7.0 (GLOVE) ×2 IMPLANT
GLOVE BIOGEL PI IND STRL 8 (GLOVE) ×1 IMPLANT
GLOVE BIOGEL PI IND STRL 8.5 (GLOVE) IMPLANT
GLOVE BIOGEL PI INDICATOR 7.0 (GLOVE) ×2
GLOVE BIOGEL PI INDICATOR 8 (GLOVE) ×1
GLOVE BIOGEL PI INDICATOR 8.5 (GLOVE) ×1
GLOVE ECLIPSE 6.5 STRL STRAW (GLOVE) ×1 IMPLANT
GLOVE SKINSENSE NS SZ8.0 LF (GLOVE) ×1
GLOVE SKINSENSE STRL SZ8.0 LF (GLOVE) ×2 IMPLANT
GOWN STRL REUS W/ TWL XL LVL3 (GOWN DISPOSABLE) ×1 IMPLANT
GOWN STRL REUS W/TWL LRG LVL3 (GOWN DISPOSABLE) ×4 IMPLANT
GOWN STRL REUS W/TWL XL LVL3 (GOWN DISPOSABLE) ×2
INST SET MAJOR BONE (KITS) ×2 IMPLANT
KIT BLADEGUARD II DBL (SET/KITS/TRAYS/PACK) ×2 IMPLANT
KIT TURNOVER CYSTO (KITS) ×2 IMPLANT
MANIFOLD NEPTUNE II (INSTRUMENTS) ×2 IMPLANT
MARKER SKIN DUAL TIP RULER LAB (MISCELLANEOUS) ×2 IMPLANT
NDL HYPO 21X1.5 SAFETY (NEEDLE) ×1 IMPLANT
NEEDLE HYPO 21X1.5 SAFETY (NEEDLE) ×2 IMPLANT
NS IRRIG 1000ML POUR BTL (IV SOLUTION) ×2 IMPLANT
PACK BASIC III (CUSTOM PROCEDURE TRAY) ×2
PACK SRG BSC III STRL LF ECLPS (CUSTOM PROCEDURE TRAY) ×1 IMPLANT
PAD ABD 5X9 TENDERSORB (GAUZE/BANDAGES/DRESSINGS) ×2 IMPLANT
PENCIL SMOKE EVACUATOR COATED (MISCELLANEOUS) ×2 IMPLANT
PIN GUIDE DRILL TIP 2.8X300 (DRILL) ×3 IMPLANT
PIN GUIDE THRD AR 3.2X330 (PIN) ×3 IMPLANT
SCREW CANN ST 7X90 (Screw) ×3 IMPLANT
SET BASIN LINEN APH (SET/KITS/TRAYS/PACK) ×2 IMPLANT
SPONGE LAP 18X18 RF (DISPOSABLE) ×4 IMPLANT
STRIP CLOSURE SKIN 1/2X4 (GAUZE/BANDAGES/DRESSINGS) IMPLANT
SUT MNCRL AB 4-0 PS2 18 (SUTURE) ×2 IMPLANT
SUT MON AB 2-0 CT1 36 (SUTURE) ×2 IMPLANT
SUT VIC AB 0 CT1 27 (SUTURE) ×2
SUT VIC AB 0 CT1 27XBRD ANTBC (SUTURE) ×1 IMPLANT
SYR 30ML LL (SYRINGE) ×2 IMPLANT
SYR BULB IRRIG 60ML STRL (SYRINGE) ×4 IMPLANT
WASHER FLAT 7.0 (Washer) ×3 IMPLANT
YANKAUER SUCT BULB TIP 10FT TU (MISCELLANEOUS) ×2 IMPLANT

## 2020-02-27 NOTE — Op Note (Signed)
Orthopaedic Surgery Operative Note (CSN: [id number])  Kenneth Reeves  1950-11-03 Date of Surgery: 02/27/2020   Diagnoses:  Left femoral neck fracture  Procedure: CRPP of the Left femoral neck fracture   Operative Finding Successful completion of the planned procedure.  Placement of 3 x 7.0 mm cannulated screws with washers to stabilize the Valgus impacted femoral neck fracture   Post-Op Diagnosis: Same Surgeons:Primary: [name], MD Assistants:  Location: [facility] OR ROOM 4 Anesthesia: General with local anesthesia Antibiotics: Ancef 2 g Tourniquet time: * No tourniquets in log * Estimated Blood Loss: 50 cc Complications: None Specimens: None Implants: Implant Name Type Inv. Item Serial No. Manufacturer Lot No. LRB No. Used Action  WASHER FLAT 7.0 - [id number] Washer WASHER FLAT 7.0  [employer] STERILE ON SET FROM SPD Left 3 Implanted  SCREW CANN ST 7X90 - [id number] Screw SCREW CANN ST 7X90  [employer] STERILE ON SET FROM SPD Left 3 Implanted    Indications for Surgery:   Kenneth Reeves is a 70 y.o. male with a non displaced left femoral neck fracture after a mechanical fall.  After reviewing the injury with the patient, he will benefit from operative fixation.  Benefits and risks of operative and nonoperative management were discussed prior to surgery with patient/guardian(s) and informed consent form was completed.  Specific risks including infection, need for additional surgery, bleeding, nonunion, malunion, avascular necrosis, damage to surrounding structures, persistent pain and more severe complications associated with anesthesia were discussed with the patient and he elected to proceed.    Procedure:   The patient was identified properly. Informed consent was obtained and the surgical site was marked. The patient was taken up to suite where general anesthesia was induced.  The patient was positioned supine on a fracture table.  The left leg was prepped and draped in the usual  sterile fashion.  Timeout was performed before the beginning of the case.  He received Ancef prior to incision.   We started by making a lateral incision over the hip, in line with the starting point for the screws.  We incised sharply through the IT band to expose the lateral border of the femur.  Under fluoroscopic guidance, we placed the first guide wire along the inferior aspect of the femoral neck.  We ensured that the starting point was superior to the lesser trochanter so as to reduce the risk of a stress riser.  Orthogonal images confirmed the placement of the first guidewire to be inferior and posterior within the femoral neck.  We then placed the targeting device and placed 2 additional guidewires in the superior and anterior and then superior and posterior femoral neck. We used fluoroscopy to confirm we were satisfied with the positioning of the guidewires.  Using the measuring device, we determined the length of screws needed.  We then proceeded to drill the lateral cortex.  All 3 screws with a washer were then inserted under fluoroscopic guidance.  He had excellent bone and the last few turns were very difficult.  Orthogonal imaging confirmed the location of the screws of sufficient length and the washers were flush with the lateral cortex.   We irrigated the wound copiously and then closed the incision in a multilayer fashion with absorbable suture.  Sterile dressing was placed.  Patient was awoken taken to PACU in stable condition.   Post-operative plan:  The patient will be returned to the floor.   Weightbearing status:  WBAT LLE Dressing to remain in place until  POD#2/3; reinforce as needed DVT prophylaxis per primary team, no orthopedic contraindications.    Pain control with PRN pain medication preferring oral medicines.   Follow up plan will be scheduled in approximately 10-14 days days for incision check and XR of the Left hip.

## 2020-02-27 NOTE — Progress Notes (Signed)
   ORTHOPAEDIC PROGRESS NOTE  Schedule for Procedure(s): Left CANNULATED HIP PINNING  DOS: 02/27/20  SUBJECTIVE: No issues over night.  Comfortable this morning.  NPO since midnight.  Ready for OR.   OBJECTIVE: PE:  Vitals:   02/27/20 0421 02/27/20 0651  BP: 126/81 (!) 141/91  Pulse: 77 70  Resp: 18 16  Temp: 99.2 F (37.3 C) 98.4 F (36.9 C)  SpO2: 93% 94%   Alert and oriented, no acute distress Pain with movement of left hip No bruising over hip Active motion intact TA/EHL Sensation intact distally Toes are warm and well perfused  ASSESSMENT: Kenneth Reeves is a 70 y.o. male doing well; ready for OR  PLAN: Weightbearing: NWB LLE Insicional and dressing care: Reinforce dressings as needed; none currently Orthopedic device(s): None VTE prophylaxis: At discretion of primary team; please hold until POD#1  Pain control: Recommend minimal narcotics, PO pain medications PRN Follow - up plan: 2 weeks  Contact information:     Ermalinda Joubert A. Amedeo Kinsman, MD [facility] [address] [place],  [place]  [address] Phone: [phone] Fax: [phone]

## 2020-02-27 NOTE — Anesthesia Procedure Notes (Signed)
Procedure Name: LMA Insertion Date/Time: 02/27/2020 7:37 AM Performed by: Orlie Dakin, CRNA Pre-anesthesia Checklist: Patient identified, Emergency Drugs available, Suction available and Patient being monitored Patient Re-evaluated:Patient Re-evaluated prior to induction Oxygen Delivery Method: Circle system utilized Preoxygenation: Pre-oxygenation with 100% oxygen Induction Type: IV induction LMA: LMA inserted LMA Size: 4.0 Tube type: Oral Number of attempts: 1 Placement Confirmation: positive ETCO2 Tube secured with: Tape Dental Injury: Teeth and Oropharynx as per pre-operative assessment

## 2020-02-27 NOTE — Anesthesia Postprocedure Evaluation (Signed)
Anesthesia Post Note  Patient: Kenneth Reeves  Procedure(s) Performed: CANNULATED HIP PINNING (Left Hip)  Patient location during evaluation: PACU Anesthesia Type: General Level of consciousness: awake and alert and oriented Pain management: pain level controlled Vital Signs Assessment: post-procedure vital signs reviewed and stable Respiratory status: spontaneous breathing and respiratory function stable Cardiovascular status: blood pressure returned to baseline Postop Assessment: no apparent nausea or vomiting Anesthetic complications: no   No complications documented.   Last Vitals:  Vitals:   02/27/20 1000 02/27/20 1048  BP: 113/79 131/82  Pulse: 74 74  Resp: 10 12  Temp:  36.5 C  SpO2: 96% 96%    Last Pain:  Vitals:   02/27/20 1048  TempSrc: Oral  PainSc: 0-No pain                 Clarabel Marion C Jarrin Staley

## 2020-02-27 NOTE — Progress Notes (Signed)
[facility] night shift.  The staff reported that the patient is complaining of nausea and indigestion. They have requested to have a treatment option for it. Prochlorperazine 5 mg IVP every 4 hr PRN ordered since it does not affect QT interval like other antiemetics.  Tennis Must, MD.

## 2020-02-27 NOTE — Progress Notes (Signed)
PROGRESS NOTE    Kenneth Reeves  [id number] DOB: 06-20-1950 DOA: 02/25/2020 PCP: Patient, No Pcp Per   Brief Narrative:  HPI: Kenneth Reeves is a 70 y.o. male with medical history significant for hypertension and prostate cancer who presents to the emergency department due to fall. Patient states that he slipped off the stairs of the porch at home around 4 PM due to ice on the stairs and landed on his left side and sustaining a severe left-sided hip pain with difficulty in being able to bear weight. EMS was activated and patient was sent to the ED for further evaluation and management. He denies numbness, tingling, headache, nausea or vomiting.  ED Course: In the emergency department,. Work-up in the ED showed leukocytosis, hypokalemia, hyperglycemia Chest x-ray showed no active disease Left hip x-ray showed subcapital versus impacted transcervical left femoral neck fracture. He was treated with IV Dilaudid, Valium was given. Orthopedic surgery (Dr. Aline Brochure) was consulted and will see the patient in the morning Hospitalist was asked to admit patient for further evaluation and management.   Assessment & Plan:   Principal Problem:   Left displaced femoral neck fracture (HCC) Active Problems:   Leukocytosis   Hypokalemia   Hyperglycemia   Prostate cancer (HCC)   Essential hypertension   Prolonged QT interval   Acute left subcapital vs impacted transcervical left femoral neck fracture: Left hip x-ray showed subcapital versus impacted transcervical left femoral neck fracture. Orthopedics on board.  Status post surgical repair.  Management per orthopedics.  Hypokalemia: Resolved.  Leukocytosis: possibly reactive Which has resolved.  Hyperglycemia possibly reactive CBG 156; patient has no history of T2DM.  Blood sugar has been fairly normal since then.  Essential hypertension: Controlled Continue Hyzaar  Prostate cancer Continue Zytiga.  BPH Continue Flomax.  Chronic  hyponatremia: Stable.   DVT prophylaxis: heparin injection 5,000 Units Start: 02/27/20 2200 SCDs Start: 02/25/20 2311   Code Status: Full Code  Family Communication:  None present at bedside.  Plan of care discussed with patient in length and he verbalized understanding and agreed with it.  Status is: Inpatient  Remains inpatient appropriate because:Inpatient level of care appropriate due to severity of illness   Dispo: The patient is from: Home              Anticipated d/c is to: SNF              Anticipated d/c date is: 3 days              Patient currently is not medically stable to d/c.        Estimated body mass index is 28.48 kg/m as calculated from the following:   Height as of this encounter: 6' (1.829 m).   Weight as of this encounter: 95.3 kg.      Nutritional status:               Consultants:   Orthopedics  Procedures:  CRPP of the Left femoral neck fracture  Antimicrobials:  Anti-infectives (From admission, onward)   Start     Dose/Rate Route Frequency Ordered Stop   02/27/20 1400  ceFAZolin (ANCEF) IVPB 1 g/50 mL premix        1 g 100 mL/hr over 30 Minutes Intravenous Every 8 hours 02/27/20 1027 02/28/20 1359   02/27/20 0715  ceFAZolin (ANCEF) IVPB 2g/100 mL premix        2 g 200 mL/hr over 30 Minutes Intravenous On call to O.R. 02/27/20 0700 02/27/20  [id number]   02/27/20 0600  ceFAZolin (ANCEF) IVPB 2g/100 mL premix        2 g 200 mL/hr over 30 Minutes Intravenous On call to O.R. 02/26/20 1330 02/28/20 0559         Subjective: Seen and examined postoperatively.  Slightly groggy and under the influence of anesthesia.  No complaints otherwise.  Objective: Vitals:   02/27/20 0930 02/27/20 0945 02/27/20 1000 02/27/20 1048  BP: 112/69 126/84 113/79 131/82  Pulse: 75 81 74 74  Resp: 13 11 10 12   Temp:    97.7 F (36.5 C)  TempSrc:    Oral  SpO2: 93% 95% 96% 96%  Weight:      Height:        Intake/Output Summary (Last 24 hours)  at 02/27/2020 1303 Last data filed at 02/27/2020 0904 Gross per 24 hour  Intake 700 ml  Output 800 ml  Net -100 ml   Filed Weights   02/25/20 1754  Weight: 95.3 kg    Examination:  General exam: Appears calm and comfortable, slightly groggy Respiratory system: Clear to auscultation. Respiratory effort normal. Cardiovascular system: S1 & S2 heard, RRR. No JVD, murmurs, rubs, gallops or clicks. No pedal edema. Gastrointestinal system: Abdomen is nondistended, soft and nontender. No organomegaly or masses felt. Normal bowel sounds heard.  Data Reviewed: I have personally reviewed following labs and imaging studies  CBC: Recent Labs  Lab 02/25/20 2114 02/26/20 0850 02/27/20 0517  WBC 11.8* 7.6 6.5  NEUTROABS 10.0*  --  4.2  HGB 12.6* 11.6* 11.3*  HCT 37.2* 33.7* 33.0*  MCV 94.7 94.4 95.1  PLT 202 169 [id number]   Basic Metabolic Panel: Recent Labs  Lab 02/25/20 2114 02/26/20 0850 02/27/20 0517  NA 135 132*  131* 132*  K 2.7* 2.9*  2.9* 3.5  CL 99 98  98 99  CO2 27 26  26 26   GLUCOSE 156* 111*  108* 102*  BUN 12 13  14 10   CREATININE 1.08 0.90  0.91 0.89  CALCIUM 8.7* 8.2*  8.1* 8.2*  MG  --  2.1  --   PHOS  --  3.3  --    GFR: Estimated Creatinine Clearance: 96.5 mL/min (by C-G formula based on SCr of 0.89 mg/dL). Liver Function Tests: Recent Labs  Lab 02/25/20 2114 02/26/20 0850  AST 24 19  ALT 26 22  ALKPHOS 72 63  BILITOT 0.7 1.1  PROT 6.4* 5.7*  ALBUMIN 3.7 3.2*   No results for input(s): LIPASE, AMYLASE in the last 168 hours. No results for input(s): AMMONIA in the last 168 hours. Coagulation Profile: Recent Labs  Lab 02/25/20 2114 02/26/20 0850  INR 0.9 1.0   Cardiac Enzymes: No results for input(s): CKTOTAL, CKMB, CKMBINDEX, TROPONINI in the last 168 hours. BNP (last 3 results) No results for input(s): PROBNP in the last 8760 hours. HbA1C: No results for input(s): HGBA1C in the last 72 hours. CBG: No results for input(s): GLUCAP in  the last 168 hours. Lipid Profile: No results for input(s): CHOL, HDL, LDLCALC, TRIG, CHOLHDL, LDLDIRECT in the last 72 hours. Thyroid Function Tests: No results for input(s): TSH, T4TOTAL, FREET4, T3FREE, THYROIDAB in the last 72 hours. Anemia Panel: No results for input(s): VITAMINB12, FOLATE, FERRITIN, TIBC, IRON, RETICCTPCT in the last 72 hours. Sepsis Labs: No results for input(s): PROCALCITON, LATICACIDVEN in the last 168 hours.  Recent Results (from the past 240 hour(s))  SARS CORONAVIRUS 2 (TAT 6-24 HRS) Nasopharyngeal Nasopharyngeal Swab  Status: None   Collection Time: 02/25/20  8:44 PM   Specimen: Nasopharyngeal Swab  Result Value Ref Range Status   SARS Coronavirus 2 NEGATIVE NEGATIVE Final    Comment: (NOTE) SARS-CoV-2 target nucleic acids are NOT DETECTED.  The SARS-CoV-2 RNA is generally detectable in upper and lower respiratory specimens during the acute phase of infection. Negative results do not preclude SARS-CoV-2 infection, do not rule out co-infections with other pathogens, and should not be used as the sole basis for treatment or other patient management decisions. Negative results must be combined with clinical observations, patient history, and epidemiological information. The expected result is Negative.  Fact Sheet for Patients: SugarRoll.be  Fact Sheet for Healthcare Providers: https://www.woods-mathews.com/  This test is not yet approved or cleared by the [place] FDA and  has been authorized for detection and/or diagnosis of SARS-CoV-2 by FDA under an Emergency Use Authorization (EUA). This EUA will remain  in effect (meaning this test can be used) for the duration of the COVID-19 declaration under Se ction 564(b)(1) of the Act, 21 U.S.C. section 360bbb-3(b)(1), unless the authorization is terminated or revoked sooner.  Performed at [facility], [place] [address], [place], [place] [address]    MRSA PCR Screening     Status: None   Collection Time: 02/26/20  9:03 PM   Specimen: Nasal Mucosa; Nasopharyngeal  Result Value Ref Range Status   MRSA by PCR NEGATIVE NEGATIVE Final    Comment:        The GeneXpert MRSA Assay (FDA approved for NASAL specimens only), is one component of a comprehensive MRSA colonization surveillance program. It is not intended to diagnose MRSA infection nor to guide or monitor treatment for MRSA infections. Performed at [facility], [address] [place], [place] [address]       Radiology Studies: [facility] Chest [facility] 1 View  Result Date: 02/25/2020 CLINICAL DATA:  Status post fall. EXAM: PORTABLE CHEST 1 VIEW COMPARISON:  None. FINDINGS: Mild, diffuse, chronic appearing increased lung markings are seen. There is no evidence of acute infiltrate, pleural effusion or pneumothorax. The heart size and mediastinal contours are within normal limits. The visualized skeletal structures are unremarkable. IMPRESSION: No active disease. Electronically Signed   By: Virgina Norfolk M.D.   On: 02/25/2020 20:53   DG Humerus Left  Result Date: 02/26/2020 CLINICAL DATA:  Fall. EXAM: LEFT HUMERUS - 2+ VIEW COMPARISON:  No recent prior. FINDINGS: No acute bony or joint abnormality. No evidence of fracture or dislocation. IMPRESSION: No acute abnormality. Electronically Signed   By: Marcello Moores  Register   On: 02/26/2020 07:44   DG HIP OPERATIVE UNILAT W OR W/O PELVIS LEFT  Result Date: 02/27/2020 CLINICAL DATA:  Hip fracture fixation EXAM: OPERATIVE LEFT HIP (WITH PELVIS IF PERFORMED) 1 VIEW TECHNIQUE: Fluoroscopic spot image(s) were submitted for interpretation post-operatively. COMPARISON:  None. FINDINGS: Intraoperative radiographs demonstrate placement of 3 cannulated screws spanning the femoral neck fracture. IMPRESSION: Fluoroscopic guidance for left femoral neck fracture fixation. Electronically Signed   By: Macy Mis M.D.   On: 02/27/2020 09:23   DG HIP UNILAT  WITH PELVIS 2-3 VIEWS LEFT  Result Date: 02/27/2020 CLINICAL DATA:  Postoperative fixation for fracture EXAM: DG HIP 2-3V LEFT COMPARISON:  February 25, 2020; intraoperative images February 27, 2020 FINDINGS: Frontal and lateral views obtained. There are 3 screws transfixing a left femoral neck fracture with screw tips in the proximal left femoral head. Alignment at fracture site anatomic. No new fracture. No dislocation. Slight  narrowing left hip joint. IMPRESSION: Screw fixation for femoral neck fracture with alignment anatomic. No new fracture. No dislocation. Mild narrowing left hip joint. Electronically Signed   By: Lowella Grip III M.D.   On: 02/27/2020 09:59   DG Hip Unilat W or Wo Pelvis 2-3 Views Left  Result Date: 02/25/2020 CLINICAL DATA:  fell 3 steps hitting his left hip. EXAM: DG HIP (WITH OR WITHOUT PELVIS) 2-3V LEFT COMPARISON:  X-ray pelvis 11/20/2013. FINDINGS: Subcapital versus impacted transcervical left femoral neck fracture. Frontal view of the pelvis and right hip are grossly unremarkable. There is no evidence of severe arthropathy or other focal bone abnormality. Subcutaneus soft tissue are grossly unremarkable. Vascular calcifications. IMPRESSION: Subcapital versus impacted transcervical left femoral neck fracture. Electronically Signed   By: Iven Finn M.D.   On: 02/25/2020 19:41    Scheduled Meds: . abiraterone acetate  1,000 mg Oral Daily  . heparin  5,000 Units Subcutaneous Q8H  . losartan  50 mg Oral Daily   And  . hydrochlorothiazide  12.5 mg Oral Daily  . predniSONE  5 mg Oral Q breakfast  . tamsulosin  0.4 mg Oral Daily   Continuous Infusions: .  ceFAZolin (ANCEF) IV    .  ceFAZolin (ANCEF) IV       LOS: 2 days   Time spent: 27 minutes   Darliss Cheney, MD [facility]  02/27/2020, 1:03 PM   To contact the attending provider between 7A-7P or the covering provider during after hours 7P-7A, please log into the web site [email or web address].

## 2020-02-27 NOTE — Progress Notes (Signed)
PT Cancellation Note  Patient Details Name: Kenneth Reeves MRN: [id number] DOB: 03-12-1950   Cancelled Treatment:    Reason Eval/Treat Not Completed: Medical issues which prohibited therapy.  Patient having surgery today, will check back tomorrow.   8:17 AM, 02/27/20 Lonell Grandchild, MPT Physical Therapist with [facility] [phone] office [phone] mobile phone

## 2020-02-27 NOTE — Transfer of Care (Signed)
Immediate Anesthesia Transfer of Care Note  Patient: Kenneth Reeves  Procedure(s) Performed: CANNULATED HIP PINNING (Left Hip)  Patient Location: PACU  Anesthesia Type:General  Level of Consciousness: awake, alert  and oriented  Airway & Oxygen Therapy: Patient Spontanous Breathing and Patient connected to face mask oxygen  Post-op Assessment: Report given to RN and Post -op Vital signs reviewed and stable  Post vital signs: Reviewed and stable  Last Vitals:  Vitals Value Taken Time  BP    Temp    Pulse 87 02/27/20 0904  Resp 13 02/27/20 0904  SpO2 100 % 02/27/20 0904  Vitals shown include unvalidated device data.  Last Pain:  Vitals:   02/27/20 0651  TempSrc: Oral  PainSc: 7       Patients Stated Pain Goal: 5 (54/98/26 4158)  Complications: No complications documented.

## 2020-02-28 ENCOUNTER — Encounter (HOSPITAL_COMMUNITY): Payer: Self-pay | Admitting: Internal Medicine

## 2020-02-28 DIAGNOSIS — R531 Weakness: Secondary | ICD-10-CM | POA: Diagnosis not present

## 2020-02-28 LAB — CBC
HCT: 32.1 % — ABNORMAL LOW (ref 39.0–52.0)
Hemoglobin: 10.8 g/dL — ABNORMAL LOW (ref 13.0–17.0)
MCH: 32 pg (ref 26.0–34.0)
MCHC: 33.6 g/dL (ref 30.0–36.0)
MCV: 95.3 fL (ref 80.0–100.0)
Platelets: 170 10*3/uL (ref 150–400)
RBC: 3.37 MIL/uL — ABNORMAL LOW (ref 4.22–5.81)
RDW: 12.1 % (ref 11.5–15.5)
WBC: 8 10*3/uL (ref 4.0–10.5)
nRBC: 0 % (ref 0.0–0.2)

## 2020-02-28 LAB — BASIC METABOLIC PANEL
Anion gap: 8 (ref 5–15)
BUN: 13 mg/dL (ref 8–23)
CO2: 25 mmol/L (ref 22–32)
Calcium: 8.3 mg/dL — ABNORMAL LOW (ref 8.9–10.3)
Chloride: 100 mmol/L (ref 98–111)
Creatinine, Ser: 0.85 mg/dL (ref 0.61–1.24)
GFR, Estimated: >60 mL/min (ref >60)
Glucose, Bld: 132 mg/dL — ABNORMAL HIGH (ref 70–99)
Potassium: 3.7 mmol/L (ref 3.5–5.1)
Sodium: 133 mmol/L — ABNORMAL LOW (ref 135–145)

## 2020-02-28 MED ORDER — HYDROCODONE-ACETAMINOPHEN 5-325 MG PO TABS
1.0000 | ORAL_TABLET | ORAL | 0 refills | Status: AC | PRN
Start: 2020-02-28 — End: ?

## 2020-02-28 MED ORDER — ASPIRIN EC 81 MG PO TBEC: 81.0000 mg | DELAYED_RELEASE_TABLET | Freq: Two times a day (BID) | ORAL | 0 refills | Status: AC

## 2020-02-28 NOTE — Progress Notes (Signed)
   ORTHOPAEDIC PROGRESS NOTE  s/p Procedure(s): Left CANNULATED HIP PINNING  DOS: 02/27/20  SUBJECTIVE: Pain is well controlled.  Slept well over night.  He has worked with PT and minimal pain with ambulation.  Has pain in proximal thigh when he tries to bend his knee.    OBJECTIVE: PE:  Vitals:   02/27/20 2056 02/28/20 0529  BP: 133/90 121/77  Pulse: 95 78  Resp: 18 18  Temp: 99.1 F (37.3 C) 98.4 F (36.9 C)  SpO2: 96% 94%   Alert and oriented, no acute distress Pain with attempted straight leg raise and knee extension No bruising over hip Active motion intact TA/EHL Sensation intact distally Toes are warm and well perfused Dressing is clean, dry and intact  ASSESSMENT: Kenneth Reeves is a 70 y.o. male doing well postop  PLAN: Weightbearing: NWB LLE Insicional and dressing care: Reinforce dressings as needed; can be change if soiled Orthopedic device(s): None VTE prophylaxis: At discretion of primary team; please hold until POD#1  Pain control: Recommend minimal narcotics, PO pain medications PRN Follow - up plan: 2 weeks; appointment has been scheduled.  Patient can contact the clinic to reschedule if date and time not convenient.   Contact information:     Lorella Gomez A. Amedeo Kinsman, MD [facility] [address] [place],  [place]  [address] Phone: [phone] Fax: [phone]

## 2020-02-28 NOTE — Evaluation (Signed)
Physical Therapy Evaluation Patient Details Name: Kenneth Reeves MRN: [id number] DOB: 05-Jun-1950 Today's Date: 02/28/2020   History of Present Illness  Kenneth Reeves is a 70 y.o. male with medical history significant for hypertension and prostate cancer who presents to the emergency department due to fall. Patient states that he slipped off the stairs of the porch at home around 4 PM due to ice on the stairs and landed on his left side and sustaining a severe left-sided hip pain with difficulty in being able to bear weight. EMS was activated and patient was sent to the ED for further evaluation and management. He denies numbness, tingling, headache, nausea or vomiting.    Clinical Impression  Patient seated in chair at beginning of session and is eager to participate. Patient transfers to standing with RW without physical assist and is provided with min guard assist for safety/balance. Patient ambulates with slow, antalgic gait on LLE with use of RW without loss of balance. Patient educated on WBAT status and he tolerates increased weightbearing on LLE throughout ambulation. Patient will require a RW for safety with ambulation and weightbearing and a 3 in 1 for safety with ADL. Patient will benefit from continued physical therapy in hospital and recommended venue below to increase strength, balance, endurance for safe ADLs and gait.     Follow Up Recommendations Home health PT;Supervision - Intermittent;Supervision for mobility/OOB    Equipment Recommendations  Rolling walker with 5" wheels;3in1 (PT)    Recommendations for Other Services       Precautions / Restrictions Precautions Precautions: Fall Restrictions Weight Bearing Restrictions: Yes LLE Weight Bearing: Weight bearing as tolerated      Mobility  Bed Mobility Overal bed mobility: Needs Assistance Bed Mobility: Supine to Sit     Supine to sit: Min assist     General bed mobility comments: seated in chair at beginning of  session    Transfers Overall transfer level: Needs assistance Equipment used: Rolling walker (2 wheeled) Transfers: Sit to/from [facility] Sit to Stand: Min guard Stand pivot transfers: Min guard       General transfer comment: min guard for safety/balance, transfers to/from standing with RW, impaired eccentric control with transition from stand to sit secondary to L hip pain  Ambulation/Gait Ambulation/Gait assistance: Min guard Gait Distance (Feet): 90 Feet Assistive device: Rolling walker (2 wheeled) Gait Pattern/deviations: Decreased stride length;Step-through pattern;Antalgic Gait velocity: decreased   General Gait Details: Ambulates with RW with antalgic gait on LLE, able to tolerate increased weightbearing throughout, slow cadence but without loss of balance  Stairs            Wheelchair Mobility    Modified Rankin (Stroke Patients Only)       Balance Overall balance assessment: Needs assistance Sitting-balance support: No upper extremity supported;Feet supported Sitting balance-Leahy Scale: Normal Sitting balance - Comments: seated edge of chair   Standing balance support: Bilateral upper extremity supported Standing balance-Leahy Scale: Good Standing balance comment: with RW                             Pertinent Vitals/Pain Pain Assessment: No/denies pain    Home Living Family/patient expects to be discharged to:: Private residence Living Arrangements: Children Available Help at Discharge: Family (Son at home but recently broke his foot and is on crutches, family lives up the road) Type of Home: House Home Access: Stairs to enter Entrance Stairs-Rails: None Entrance Stairs-Number of Steps: 2  front entrance ; 3 or 4 side entrance Home Layout: Two level Home Equipment: Crutches;Other (comment) (Mentioned possibly having an old walker.) Additional Comments: Reported that Son still does all the cooking but would not be  able to help with transfers and ambulation.    Prior Function Level of Independence: Independent         Comments: household ambulator     Hand Dominance   Dominant Hand: Right    Extremity/Trunk Assessment   Upper Extremity Assessment Upper Extremity Assessment: Defer to OT evaluation    Lower Extremity Assessment Lower Extremity Assessment: Overall WFL for tasks assessed    Cervical / Trunk Assessment Cervical / Trunk Assessment: Normal  Communication   Communication: No difficulties  Cognition Arousal/Alertness: Awake/alert Behavior During Therapy: WFL for tasks assessed/performed Overall Cognitive Status: Within Functional Limits for tasks assessed                                        General Comments      Exercises     Assessment/Plan    PT Assessment Patient needs continued PT services  PT Problem List Decreased strength;Decreased mobility;Decreased range of motion;Decreased activity tolerance;Decreased balance;Decreased knowledge of use of DME;Pain       PT Treatment Interventions Therapeutic exercise;Gait training;DME instruction;Balance training;Stair training;Neuromuscular re-education;Functional mobility training;Therapeutic activities;Patient/family education    PT Goals (Current goals can be found in the Care Plan section)  Acute Rehab PT Goals Patient Stated Goal: Return home PT Goal Formulation: With patient Time For Goal Achievement: 03/13/20 Potential to Achieve Goals: Good    Frequency Min 3X/week   Barriers to discharge        Co-evaluation               AM-PAC PT "6 Clicks" Mobility  Outcome Measure Help needed turning from your back to your side while in a flat bed without using bedrails?: None Help needed moving from lying on your back to sitting on the side of a flat bed without using bedrails?: A Little Help needed moving to and from a bed to a chair (including a wheelchair)?: None Help needed  standing up from a chair using your arms (e.g., wheelchair or bedside chair)?: None Help needed to walk in hospital room?: A Little Help needed climbing 3-5 steps with a railing? : A Lot 6 Click Score: 20    End of Session Equipment Utilized During Treatment: Gait belt Activity Tolerance: Patient tolerated treatment well Patient left: in chair;with call bell/phone within reach Nurse Communication: Mobility status PT Visit Diagnosis: Unsteadiness on feet (R26.81);Other abnormalities of gait and mobility (R26.89);Muscle weakness (generalized) (M62.81)    Time: 7829-5621 PT Time Calculation (min) (ACUTE ONLY): 23 min   Charges:   PT Evaluation $PT Eval Low Complexity: 1 Low PT Treatments $Therapeutic Activity: 8-22 mins        9:35 AM, 02/28/20 Mearl Latin PT, DPT Physical Therapist at [facility]

## 2020-02-28 NOTE — Plan of Care (Signed)
°  Problem: Acute Rehab PT Goals(only PT should resolve) Goal: Patient Will Transfer Sit To/From Stand Outcome: Progressing Flowsheets (Taken 02/28/2020 [phone]) Patient will transfer sit to/from stand: with modified independence Goal: Pt Will Transfer Bed To Chair/Chair To Bed Outcome: Progressing Flowsheets (Taken 02/28/2020 0937) Pt will Transfer Bed to Chair/Chair to Bed: with modified independence Goal: Pt Will Ambulate Outcome: Progressing Flowsheets (Taken 02/28/2020 0937) Pt will Ambulate:  100 feet  with modified independence  with rolling walker Goal: Pt/caregiver will Perform Home Exercise Program Outcome: Progressing Flowsheets (Taken 02/28/2020 0937) Pt/caregiver will Perform Home Exercise Program:  For increased strengthening  For improved balance  Independently  9:38 AM, 02/28/20 Mearl Latin PT, DPT Physical Therapist at [facility]

## 2020-02-28 NOTE — Care Management Important Message (Signed)
Important Message  Patient Details  Name: Kenneth Reeves MRN: [id number] Date of Birth: 04/02/1950   Medicare Important Message Given:  Yes     Tommy Medal 02/28/2020, 12:35 PM

## 2020-02-28 NOTE — Discharge Summary (Signed)
Physician Discharge Summary  Jaiyden Laur GEX:[id number] DOB: [birth year] DOA: 02/25/2020  PCP: Patient, No Pcp Per  Admit date: 02/25/2020 Discharge date: 02/28/2020 30 Day Unplanned Readmission Risk Score   Flowsheet Row ED to Hosp-Admission (Current) from 02/25/2020 in [facility]  30 Day Unplanned Readmission Risk Score (%) 19.22 Filed at 02/28/2020 0801     This score is the patient's risk of an unplanned readmission within 30 days of being discharged (0 -100%). The score is based on dignosis, age, lab data, medications, orders, and past utilization.   Low:  0-14.9   Medium: 15-21.9   High: 22-29.9   Extreme: 30 and above         Admitted From: Home Disposition: Home  Recommendations for Outpatient Follow-up:  1. Follow up with PCP in 1-2 weeks 2. Follow-up with orthopedics, date to be decided by orthopedics 3. Follow-up instructions about recent hip surgery by orthopedics as well. 4. Please obtain BMP/CBC in one week 5. Please follow up with your PCP on the following pending results: Unresulted Labs (From admission, onward)         None        Home Health: Yes Equipment/Devices: Rolling walker and 3 in 1 commode  Discharge Condition: Stable CODE STATUS: Full code Diet recommendation: Cardiac  Subjective: Patient seen and examined.  He states that he is feeling so well that he would like to go home.  Brief/Interim Summary: Sinjin Amero a 70 y.o.malewith medical history significant forhypertension and prostate cancer who presented to ED due to fall. he slipped off the stairs of the porch at home.  Upon arrival to ED, he was hemodynamically stable.  Work-up in the ED showed leukocytosis, hypokalemia, hyperglycemia. Chest x-ray showed no active disease. Left hip x-ray showedsubcapital versus impacted transcervical left femoral neck fracture.  He was admitted under hospitalist service.  Orthopedic was consulted.  He underwent Left CANNULATED HIP PINNING  on 02/27/2020.  He did well.  Seen by PT OT postoperatively who recommended home with home health.  He was cleared by orthopedics. DR. Amedeo Kinsman recommended aspirin 81 mg p.o. twice daily for 6 weeks for DVT prophylaxis.  He has been prescribed some pain medications as well and will follow-up with orthopedic.  I have discussed plan of discharge with patient's daughter over the phone and she is in agreement.   Discharge Diagnoses:  Principal Problem:   Left displaced femoral neck fracture (HCC) Active Problems:   Leukocytosis   Hypokalemia   Hyperglycemia   Prostate cancer ([place])   Essential hypertension   Prolonged QT interval    Discharge Instructions   Allergies as of 02/28/2020   No Known Allergies     Medication List    TAKE these medications   abiraterone acetate 500 MG tablet Commonly known as: ZYTIGA Take 1,000 mg by mouth daily.   aspirin EC 81 MG tablet Take 1 tablet (81 mg total) by mouth 2 (68) times daily. Swallow whole.   hydrochlorothiazide 25 MG tablet Commonly known as: HYDRODIURIL Take 25 mg by mouth daily.   HYDROcodone-acetaminophen 5-325 MG tablet Commonly known as: NORCO/VICODIN Take 1 tablet by mouth every 4 (four) hours as needed for moderate pain.   losartan 100 MG tablet Commonly known as: COZAAR Take 100 mg by mouth daily.   losartan-hydrochlorothiazide 50-12.5 MG tablet Commonly known as: HYZAAR Take 1 tablet by mouth daily.   predniSONE 5 MG tablet Commonly known as: DELTASONE Take 5 mg by mouth daily with breakfast.  tamsulosin 0.4 MG Caps capsule Commonly known as: FLOMAX Take 0.4 mg by mouth daily.            Durable Medical Equipment  (From admission, onward)         Start     Ordered   02/28/20 1100  For home use only DME 3 n 1  Once        02/28/20 1059   02/28/20 1100  For home use only DME Walker rolling  Once       Question Answer Comment  Walker: With 5 Inch Wheels   Patient needs a walker to treat with the  following condition Closed left hip fracture ([place])      02/28/20 1059          No Known Allergies  Consultations: Orthopedics   Procedures/Studies: DG Chest Port 1 View  Result Date: 02/25/2020 CLINICAL DATA:  Status post fall. EXAM: PORTABLE CHEST 1 VIEW COMPARISON:  None. FINDINGS: Mild, diffuse, chronic appearing increased lung markings are seen. There is no evidence of acute infiltrate, pleural effusion or pneumothorax. The heart size and mediastinal contours are within normal limits. The visualized skeletal structures are unremarkable. IMPRESSION: No active disease. Electronically Signed   By: Virgina Norfolk M.D.   On: 02/25/2020 20:53   DG Humerus Left  Result Date: 02/26/2020 CLINICAL DATA:  Fall. EXAM: LEFT HUMERUS - 2+ VIEW COMPARISON:  No recent prior. FINDINGS: No acute bony or joint abnormality. No evidence of fracture or dislocation. IMPRESSION: No acute abnormality. Electronically Signed   By: Marcello Moores  Register   On: 02/26/2020 07:44   DG HIP OPERATIVE UNILAT W OR W/O PELVIS LEFT  Result Date: 02/27/2020 CLINICAL DATA:  Hip fracture fixation EXAM: OPERATIVE LEFT HIP (WITH PELVIS IF PERFORMED) 1 VIEW TECHNIQUE: Fluoroscopic spot image(s) were submitted for interpretation post-operatively. COMPARISON:  None. FINDINGS: Intraoperative radiographs demonstrate placement of 3 cannulated screws spanning the femoral neck fracture. IMPRESSION: Fluoroscopic guidance for left femoral neck fracture fixation. Electronically Signed   By: Macy Mis M.D.   On: 02/27/2020 09:23   DG HIP UNILAT WITH PELVIS 2-3 VIEWS LEFT  Result Date: 02/27/2020 CLINICAL DATA:  Postoperative fixation for fracture EXAM: DG HIP 2-3V LEFT COMPARISON:  February 25, 2020; intraoperative images February 27, 2020 FINDINGS: Frontal and lateral views obtained. There are 3 screws transfixing a left femoral neck fracture with screw tips in the proximal left femoral head. Alignment at fracture site anatomic. No new  fracture. No dislocation. Slight narrowing left hip joint. IMPRESSION: Screw fixation for femoral neck fracture with alignment anatomic. No new fracture. No dislocation. Mild narrowing left hip joint. Electronically Signed   By: Lowella Grip III M.D.   On: 02/27/2020 09:59   DG Hip Unilat W or Wo Pelvis 2-3 Views Left  Result Date: 02/25/2020 CLINICAL DATA:  fell 3 steps hitting his left hip. EXAM: DG HIP (WITH OR WITHOUT PELVIS) 2-3V LEFT COMPARISON:  X-ray pelvis 11/20/2013. FINDINGS: Subcapital versus impacted transcervical left femoral neck fracture. Frontal view of the pelvis and right hip are grossly unremarkable. There is no evidence of severe arthropathy or other focal bone abnormality. Subcutaneus soft tissue are grossly unremarkable. Vascular calcifications. IMPRESSION: Subcapital versus impacted transcervical left femoral neck fracture. Electronically Signed   By: Iven Finn M.D.   On: 02/25/2020 19:41      Discharge Exam: Vitals:   02/27/20 2056 02/28/20 0529  BP: 133/90 121/77  Pulse: 95 78  Resp: 18 18  Temp: 99.1 F (  37.3 C) 98.4 F (36.9 C)  SpO2: 96% 94%   Vitals:   02/27/20 1441 02/27/20 1823 02/27/20 2056 02/28/20 0529  BP: (!) 120/94 (!) 145/100 133/90 121/77  Pulse: 96 99 95 78  Resp: 20 18 18 18   Temp: 98.8 F (37.1 C) 99 F (37.2 C) 99.1 F (37.3 C) 98.4 F (36.9 C)  TempSrc: Oral Oral Oral Oral  SpO2: 95% 94% 96% 94%  Weight:      Height:        General: Pt is alert, awake, not in acute distress Cardiovascular: RRR, S1/S2 +, no rubs, no gallops Respiratory: CTA bilaterally, no wheezing, no rhonchi Abdominal: Soft, NT, ND, bowel sounds + Extremities: no edema, no cyanosis    The results of significant diagnostics from this hospitalization (including imaging, microbiology, ancillary and laboratory) are listed below for reference.     Microbiology: Recent Results (from the past 240 hour(s))  SARS CORONAVIRUS 2 (TAT 6-24 HRS)  Nasopharyngeal Nasopharyngeal Swab     Status: None   Collection Time: 02/25/20  8:44 PM   Specimen: Nasopharyngeal Swab  Result Value Ref Range Status   SARS Coronavirus 2 NEGATIVE NEGATIVE Final    Comment: (NOTE) SARS-CoV-2 target nucleic acids are NOT DETECTED.  The SARS-CoV-2 RNA is generally detectable in upper and lower respiratory specimens during the acute phase of infection. Negative results do not preclude SARS-CoV-2 infection, do not rule out co-infections with other pathogens, and should not be used as the sole basis for treatment or other patient management decisions. Negative results must be combined with clinical observations, patient history, and epidemiological information. The expected result is Negative.  Fact Sheet for Patients: SugarRoll.be  Fact Sheet for Healthcare Providers: https://www.woods-mathews.com/  This test is not yet approved or cleared by the [place] FDA and  has been authorized for detection and/or diagnosis of SARS-CoV-2 by FDA under an Emergency Use Authorization (EUA). This EUA will remain  in effect (meaning this test can be used) for the duration of the COVID-19 declaration under Se ction 564(b)(1) of the Act, 21 U.S.C. section 360bbb-3(b)(1), unless the authorization is terminated or revoked sooner.  Performed at [facility], [place] [address] [place], [place] [address]   MRSA PCR Screening     Status: None   Collection Time: 02/26/20  9:03 PM   Specimen: Nasal Mucosa; Nasopharyngeal  Result Value Ref Range Status   MRSA by PCR NEGATIVE NEGATIVE Final    Comment:        The GeneXpert MRSA Assay (FDA approved for NASAL specimens only), is one component of a comprehensive MRSA colonization surveillance program. It is not intended to diagnose MRSA infection nor to guide or monitor treatment for MRSA infections. Performed at [facility], [address] [place], [place]  [address]      Labs: BNP (last 3 results) No results for input(s): BNP in the last 8760 hours. Basic Metabolic Panel: Recent Labs  Lab 02/25/20 2114 02/26/20 0850 02/27/20 0517 02/28/20 0649  NA 135 132*  131* 132* 133*  K 2.7* 2.9*  2.9* 3.5 3.7  CL 99 98  98 99 100  CO2 27 26  26 26 25   GLUCOSE 156* 111*  108* 102* 132*  BUN 12 13  14 10 13   CREATININE 1.08 0.90  0.91 0.89 0.85  CALCIUM 8.7* 8.2*  8.1* 8.2* 8.3*  MG  --  2.1  --   --   PHOS  --  3.3  --   --  Liver Function Tests: Recent Labs  Lab 02/25/20 2114 02/26/20 0850  AST 24 19  ALT 26 22  ALKPHOS 72 63  BILITOT 0.7 1.1  PROT 6.4* 5.7*  ALBUMIN 3.7 3.2*   No results for input(s): LIPASE, AMYLASE in the last 168 hours. No results for input(s): AMMONIA in the last 168 hours. CBC: Recent Labs  Lab 02/25/20 2114 02/26/20 0850 02/27/20 0517 02/28/20 0649  WBC 11.8* 7.6 6.5 8.0  NEUTROABS 10.0*  --  4.2  --   HGB 12.6* 11.6* 11.3* 10.8*  HCT 37.2* 33.7* 33.0* 32.1*  MCV 94.7 94.4 95.1 95.3  PLT 202 169 150 170   Cardiac Enzymes: No results for input(s): CKTOTAL, CKMB, CKMBINDEX, TROPONINI in the last 168 hours. BNP: Invalid input(s): POCBNP CBG: No results for input(s): GLUCAP in the last 168 hours. D-Dimer No results for input(s): DDIMER in the last 72 hours. Hgb A1c No results for input(s): HGBA1C in the last 72 hours. Lipid Profile No results for input(s): CHOL, HDL, LDLCALC, TRIG, CHOLHDL, LDLDIRECT in the last 72 hours. Thyroid function studies No results for input(s): TSH, T4TOTAL, T3FREE, THYROIDAB in the last 72 hours.  Invalid input(s): FREET3 Anemia work up No results for input(s): VITAMINB12, FOLATE, FERRITIN, TIBC, IRON, RETICCTPCT in the last 72 hours. Urinalysis    Component Value Date/Time   COLORURINE YELLOW 08/27/2009 1349   APPEARANCEUR CLEAR 08/27/2009 1349   LABSPEC 1.016 08/27/2009 1349   PHURINE 8.0 08/27/2009 1349   GLUCOSEU NEGATIVE 08/27/2009 1349    HGBUR NEGATIVE 08/27/2009 1349   BILIRUBINUR NEGATIVE 08/27/2009 1349   KETONESUR NEGATIVE 08/27/2009 1349   PROTEINUR NEGATIVE 08/27/2009 1349   UROBILINOGEN 0.2 08/27/2009 1349   NITRITE NEGATIVE 08/27/2009 1349   LEUKOCYTESUR  08/27/2009 1349    NEGATIVE MICROSCOPIC NOT DONE ON URINES WITH NEGATIVE PROTEIN, BLOOD, LEUKOCYTES, NITRITE, OR GLUCOSE <1000 mg/dL.   Sepsis Labs Invalid input(s): PROCALCITONIN,  WBC,  LACTICIDVEN Microbiology Recent Results (from the past 240 hour(s))  SARS CORONAVIRUS 2 (TAT 6-24 HRS) Nasopharyngeal Nasopharyngeal Swab     Status: None   Collection Time: 02/25/20  8:44 PM   Specimen: Nasopharyngeal Swab  Result Value Ref Range Status   SARS Coronavirus 2 NEGATIVE NEGATIVE Final    Comment: (NOTE) SARS-CoV-2 target nucleic acids are NOT DETECTED.  The SARS-CoV-2 RNA is generally detectable in upper and lower respiratory specimens during the acute phase of infection. Negative results do not preclude SARS-CoV-2 infection, do not rule out co-infections with other pathogens, and should not be used as the sole basis for treatment or other patient management decisions. Negative results must be combined with clinical observations, patient history, and epidemiological information. The expected result is Negative.  Fact Sheet for Patients: SugarRoll.be  Fact Sheet for Healthcare Providers: https://www.woods-mathews.com/  This test is not yet approved or cleared by the [place] FDA and  has been authorized for detection and/or diagnosis of SARS-CoV-2 by FDA under an Emergency Use Authorization (EUA). This EUA will remain  in effect (meaning this test can be used) for the duration of the COVID-19 declaration under Se ction 564(b)(1) of the Act, 21 U.S.C. section 360bbb-3(b)(1), unless the authorization is terminated or revoked sooner.  Performed at [facility], [place] [address] [place],  [place] [address]   MRSA PCR Screening     Status: None   Collection Time: 02/26/20  9:03 PM   Specimen: Nasal Mucosa; Nasopharyngeal  Result Value Ref Range Status   MRSA by PCR NEGATIVE NEGATIVE  Final    Comment:        The GeneXpert MRSA Assay (FDA approved for NASAL specimens only), is one component of a comprehensive MRSA colonization surveillance program. It is not intended to diagnose MRSA infection nor to guide or monitor treatment for MRSA infections. Performed at [facility], [address] [place], [place] [address]      Time coordinating discharge: Over 30 minutes  SIGNED:   Darliss Cheney, MD  [facility] 02/28/2020, 11:02 AM  If 7PM-7AM, please contact night-coverage [email or web address]

## 2020-02-28 NOTE — TOC Transition Note (Signed)
Transition of Care [facility]) - CM/SW Discharge Note  Patient Details  Name: Kenneth Reeves MRN: [id number] Date of Birth: 1950/08/06  Transition of Care [facility]) CM/SW Contact:  Sherie Don, LCSW Phone Number: 02/28/2020, 12:05 PM  Clinical Narrative: Patient to discharge today. PT evaluation recommended HHPT and OT recommended HHOT. CSW spoke with patient regarding [place] and DME recommendations. Patient agreeable to referrals. CSW made referral to [facility] for [place] and [place]. CSW made referral to [facility] with [employer] for 3N1 and rolling walker. Walker to be delivered to room. Orders in for [facility] and DME. HH added to AVS. TOC signing off.  Final next level of care: [place] Barriers to Discharge: Barriers Resolved  Patient Goals and CMS Choice Patient states their goals for this hospitalization and ongoing recovery are:: Discharge home with [facility] CMS Medicare.gov Compare Post Acute Care list provided to:: Patient Choice offered to / list presented to : Patient  Discharge Plan and Services        DME Arranged: 3-N-1,Walker rolling DME Agency: [employer] Date DME Agency Contacted: 02/28/20 Time DME Agency Contacted: [id number] Representative spoke with at DME Agency: Barbaraann Rondo [place]: PT,OT [place] Agency: [facility] [employer] Date [place]: 02/28/20 Time [place]: 1039 Representative spoke with at [place]: Helene Kelp  Readmission Risk Interventions No flowsheet data found.

## 2020-02-28 NOTE — Evaluation (Signed)
Occupational Therapy Evaluation Patient Details Name: Kenneth Reeves MRN: [id number] DOB: 12-28-1950 Today's Date: 02/28/2020    History of Present Illness Kenneth Reeves is a 70 y.o. male with medical history significant for hypertension and prostate cancer who presents to the emergency department due to fall. Patient states that he slipped off the stairs of the porch at home around 4 PM due to ice on the stairs and landed on his left side and sustaining a severe left-sided hip pain with difficulty in being able to bear weight. EMS was activated and patient was sent to the ED for further evaluation and management. He denies numbness, tingling, headache, nausea or vomiting.   Clinical Impression   Pt agreeable to OT evlauation. Pt able to complete ambulatory transfer with supervision to min guard assist with use of RW. Pt able to stand at sink and wash hands. No pain reported unless in movement at which time he experiences 7 or 8/10 pain in L hip. Pt not recommended for further acute OT services. Recommended home health OT on discharge to assess home environment and any modification.     Follow Up Recommendations  Home health OT    Equipment Recommendations  Tub/shower bench;Other (comment) (Rolling walker.)       Precautions / Restrictions Precautions Precautions: Fall Restrictions Weight Bearing Restrictions: Yes LLE Weight Bearing: Weight bearing as tolerated      Mobility Bed Mobility Overal bed mobility: Needs Assistance Bed Mobility: Supine to Sit     Supine to sit: Min assist     General bed mobility comments: Pt required Min assist to move L LE to EOB. Pt educated on use of R LE to assist with movement but pt continued to request physical assist from this OT.     Transfers Overall transfer level: Needs assistance Equipment used: Rolling walker (2 wheeled) Transfers: Sit to/from [facility] Sit to Stand: Min guard Stand pivot transfers: Min guard        General transfer comment: min guard for safety/balance, transfers to/from standing with RW, impaired eccentric control with transition from stand to sit secondary to L hip pain     Overall balance assessment: Needs assistance Sitting-balance support: No upper extremity supported;Feet supported Sitting balance-Leahy Scale: Normal Sitting balance - Comments: seated edge of chair   Standing balance support: Bilateral upper extremity supported Standing balance-Leahy Scale: Good Standing balance comment: with RW                           ADL either performed or assessed with clinical judgement   ADL Overall ADL's : Needs assistance/impaired     Grooming: Supervision/safety;Standing Grooming Details (indicate cue type and reason): Able to stand at sink to wash hands.                 Toilet Transfer: Supervision/safety;Min guard;Grab bars Armed forces technical officer Details (indicate cue type and reason): Able to ambulate to the toilet and stand to urinate using grab bars as needed. Toileting- Clothing Manipulation and Hygiene: Supervision/safety Toileting - Clothing Manipulation Details (indicate cue type and reason): Able to lift gown to urinate while standing.     Functional mobility during ADLs: Supervision/safety;Min guard                           Pertinent Vitals/Pain Pain Assessment: No/denies pain     Hand Dominance Right   Extremity/Trunk Assessment Upper Extremity Assessment Upper Extremity  Assessment: Defer to OT evaluation   Lower Extremity Assessment Lower Extremity Assessment: Overall WFL for tasks assessed   Cervical / Trunk Assessment Cervical / Trunk Assessment: Normal   Communication Communication Communication: No difficulties   Cognition Arousal/Alertness: Awake/alert Behavior During Therapy: WFL for tasks assessed/performed Overall Cognitive Status: Within Functional Limits for tasks assessed                                                       Home Living Family/patient expects to be discharged to:: Private residence Living Arrangements: Children Available Help at Discharge: Family (Son at home but recently broke his foot and is on crutches, family lives up the road) Type of Home: House Home Access: Stairs to enter [employer] of Steps: 2 front entrance ; 3 or 4 side entrance Entrance Stairs-Rails: None Home Layout: Two level Alternate Level Stairs-Number of Steps: 12 steps to second level   Bathroom Shower/Tub: Teacher, early years/pre: Standard Bathroom Accessibility: Yes How Accessible: Other (comment) (accessable from main floor) Home Equipment: Crutches;Other (comment) (Mentioned possibly having an old walker.)   Additional Comments: Reported that Son still does all the cooking but would not be able to help with transfers and ambulation.      Prior Functioning/Environment Level of Independence: Independent        Comments: household ambulator                       Acute Rehab OT Goals Patient Stated Goal: Return home                                                  End of Session Equipment Utilized During Treatment: Gait belt;Rolling walker  Activity Tolerance: Patient tolerated treatment well Patient left: in chair;with call bell/phone within reach;with chair alarm set                   Time: [phone] OT Time Calculation (min): 35 min Charges:  OT General Charges $OT Visit: 1 Visit OT Evaluation $OT Eval Low Complexity: 1 Low  Wilhemina Grall OT, MOT   Larey Seat 02/28/2020, 9:48 AM

## 2020-03-02 ENCOUNTER — Other Ambulatory Visit: Payer: Self-pay | Admitting: Orthopedic Surgery

## 2020-03-02 ENCOUNTER — Encounter (HOSPITAL_COMMUNITY): Payer: Self-pay | Admitting: Orthopedic Surgery

## 2020-03-02 DIAGNOSIS — S72002A Fracture of unspecified part of neck of left femur, initial encounter for closed fracture: Secondary | ICD-10-CM

## 2020-03-02 NOTE — Progress Notes (Signed)
Patient wants to know if it is ok to have nursing and home health sent to the patient home. She does not [facility] to [facility]. The daughter requests home health nursing and PT.

## 2020-03-04 ENCOUNTER — Telehealth: Payer: Self-pay | Admitting: Orthopedic Surgery

## 2020-03-04 DIAGNOSIS — E876 Hypokalemia: Secondary | ICD-10-CM | POA: Diagnosis not present

## 2020-03-04 DIAGNOSIS — R69 Illness, unspecified: Secondary | ICD-10-CM | POA: Diagnosis not present

## 2020-03-04 DIAGNOSIS — S72002D Fracture of unspecified part of neck of left femur, subsequent encounter for closed fracture with routine healing: Secondary | ICD-10-CM | POA: Diagnosis not present

## 2020-03-04 DIAGNOSIS — C61 Malignant neoplasm of prostate: Secondary | ICD-10-CM | POA: Diagnosis not present

## 2020-03-04 DIAGNOSIS — R739 Hyperglycemia, unspecified: Secondary | ICD-10-CM | POA: Diagnosis not present

## 2020-03-04 DIAGNOSIS — G709 Myoneural disorder, unspecified: Secondary | ICD-10-CM | POA: Diagnosis not present

## 2020-03-04 DIAGNOSIS — I10 Essential (primary) hypertension: Secondary | ICD-10-CM | POA: Diagnosis not present

## 2020-03-04 DIAGNOSIS — W001XXD Fall from stairs and steps due to ice and snow, subsequent encounter: Secondary | ICD-10-CM | POA: Diagnosis not present

## 2020-03-04 DIAGNOSIS — N400 Enlarged prostate without lower urinary tract symptoms: Secondary | ICD-10-CM | POA: Diagnosis not present

## 2020-03-04 DIAGNOSIS — R9431 Abnormal electrocardiogram [ECG] [EKG]: Secondary | ICD-10-CM | POA: Diagnosis not present

## 2020-03-04 NOTE — Telephone Encounter (Signed)
Received a call from [place] that she had come to do the initial assessment and she will have someone from nursing come out once a week for 4 weeks.   Physical therapy will start tomorrow.

## 2020-03-05 ENCOUNTER — Other Ambulatory Visit: Payer: Self-pay | Admitting: *Deleted

## 2020-03-05 DIAGNOSIS — R739 Hyperglycemia, unspecified: Secondary | ICD-10-CM | POA: Diagnosis not present

## 2020-03-05 DIAGNOSIS — N400 Enlarged prostate without lower urinary tract symptoms: Secondary | ICD-10-CM | POA: Diagnosis not present

## 2020-03-05 DIAGNOSIS — R9431 Abnormal electrocardiogram [ECG] [EKG]: Secondary | ICD-10-CM | POA: Diagnosis not present

## 2020-03-05 DIAGNOSIS — R69 Illness, unspecified: Secondary | ICD-10-CM | POA: Diagnosis not present

## 2020-03-05 DIAGNOSIS — W001XXD Fall from stairs and steps due to ice and snow, subsequent encounter: Secondary | ICD-10-CM | POA: Diagnosis not present

## 2020-03-05 DIAGNOSIS — I10 Essential (primary) hypertension: Secondary | ICD-10-CM | POA: Diagnosis not present

## 2020-03-05 DIAGNOSIS — E876 Hypokalemia: Secondary | ICD-10-CM | POA: Diagnosis not present

## 2020-03-05 DIAGNOSIS — S72002D Fracture of unspecified part of neck of left femur, subsequent encounter for closed fracture with routine healing: Secondary | ICD-10-CM | POA: Diagnosis not present

## 2020-03-05 DIAGNOSIS — C61 Malignant neoplasm of prostate: Secondary | ICD-10-CM | POA: Diagnosis not present

## 2020-03-05 DIAGNOSIS — G709 Myoneural disorder, unspecified: Secondary | ICD-10-CM | POA: Diagnosis not present

## 2020-03-05 NOTE — Patient Outreach (Signed)
[facility]) Care Management  03/05/2020  Yehya Brendle 10/03/1950 [id number]  Telephone outreach for [employer] on [place] Discharge Call: Lost interest.  Spoke with Mr. Cicalese today. He states he does not like or want to receive the [place] calls. He thought it was very impersonal. He answered the questions haphazardly due to his frustration with them  Advised NP will request the calls to be stopped.  Discussed his answer of having lost interest in things. He states that was an erroneous answer. He denies depression.  He advises he is doing OK since he came home. His son lives with him and his daughter visits everyday. He is getting home health nursing and PT. He has an appt with his primary care MD this week and orthopedics next week.   He has all his meds. Patient was recently discharged from hospital and all medications have been reviewed.  Outpatient Encounter Medications as of 03/05/2020  Medication Sig  . abiraterone acetate (ZYTIGA) 500 MG tablet Take 1,000 mg by mouth daily.  Marland Kitchen aspirin EC 81 MG tablet Take 1 tablet (81 mg total) by mouth 2 (two) times daily. Swallow whole.  . hydrochlorothiazide (HYDRODIURIL) 25 MG tablet Take 25 mg by mouth daily.  Marland Kitchen HYDROcodone-acetaminophen (NORCO/VICODIN) 5-325 MG tablet Take 1 tablet by mouth every 4 (four) hours as needed for moderate pain.  Marland Kitchen losartan (COZAAR) 100 MG tablet Take 100 mg by mouth daily.  . predniSONE (DELTASONE) 5 MG tablet Take 5 mg by mouth daily with breakfast.  . tamsulosin (FLOMAX) 0.4 MG CAPS capsule Take 0.4 mg by mouth daily.  Marland Kitchen losartan-hydrochlorothiazide (HYZAAR) 50-12.5 MG tablet Take 1 tablet by mouth daily. (Patient not taking: No sig reported)   No facility-administered encounter medications on file as of 03/05/2020.   We agreed he does not need care management servicies at this time. NP to send him our information for future reference.  Eulah Pont. Myrtie Neither, MSN, [facility] Gerontological Nurse  Practitioner [facility] Care Management [phone]

## 2020-03-06 DIAGNOSIS — S72002D Fracture of unspecified part of neck of left femur, subsequent encounter for closed fracture with routine healing: Secondary | ICD-10-CM | POA: Diagnosis not present

## 2020-03-06 DIAGNOSIS — W001XXD Fall from stairs and steps due to ice and snow, subsequent encounter: Secondary | ICD-10-CM | POA: Diagnosis not present

## 2020-03-06 DIAGNOSIS — S72002M Fracture of unspecified part of neck of left femur, subsequent encounter for open fracture type I or II with nonunion: Secondary | ICD-10-CM | POA: Diagnosis not present

## 2020-03-06 DIAGNOSIS — C61 Malignant neoplasm of prostate: Secondary | ICD-10-CM | POA: Diagnosis not present

## 2020-03-06 DIAGNOSIS — G709 Myoneural disorder, unspecified: Secondary | ICD-10-CM | POA: Diagnosis not present

## 2020-03-06 DIAGNOSIS — N400 Enlarged prostate without lower urinary tract symptoms: Secondary | ICD-10-CM | POA: Diagnosis not present

## 2020-03-06 DIAGNOSIS — R69 Illness, unspecified: Secondary | ICD-10-CM | POA: Diagnosis not present

## 2020-03-06 DIAGNOSIS — R9431 Abnormal electrocardiogram [ECG] [EKG]: Secondary | ICD-10-CM | POA: Diagnosis not present

## 2020-03-06 DIAGNOSIS — I10 Essential (primary) hypertension: Secondary | ICD-10-CM | POA: Diagnosis not present

## 2020-03-06 DIAGNOSIS — E876 Hypokalemia: Secondary | ICD-10-CM | POA: Diagnosis not present

## 2020-03-06 DIAGNOSIS — R739 Hyperglycemia, unspecified: Secondary | ICD-10-CM | POA: Diagnosis not present

## 2020-03-06 DIAGNOSIS — E663 Overweight: Secondary | ICD-10-CM | POA: Diagnosis not present

## 2020-03-06 DIAGNOSIS — Z6827 Body mass index (BMI) 27.0-27.9, adult: Secondary | ICD-10-CM | POA: Diagnosis not present

## 2020-03-06 NOTE — Telephone Encounter (Signed)
I received a call from Kenneth Reeves at [facility] that he was going to continue therapy 2 times a week for this week and 3 times a week starting next week. [phone]. Verbal Order given.    FYI.

## 2020-03-09 DIAGNOSIS — W001XXD Fall from stairs and steps due to ice and snow, subsequent encounter: Secondary | ICD-10-CM | POA: Diagnosis not present

## 2020-03-09 DIAGNOSIS — R69 Illness, unspecified: Secondary | ICD-10-CM | POA: Diagnosis not present

## 2020-03-09 DIAGNOSIS — R739 Hyperglycemia, unspecified: Secondary | ICD-10-CM | POA: Diagnosis not present

## 2020-03-09 DIAGNOSIS — E876 Hypokalemia: Secondary | ICD-10-CM | POA: Diagnosis not present

## 2020-03-09 DIAGNOSIS — G709 Myoneural disorder, unspecified: Secondary | ICD-10-CM | POA: Diagnosis not present

## 2020-03-09 DIAGNOSIS — R9431 Abnormal electrocardiogram [ECG] [EKG]: Secondary | ICD-10-CM | POA: Diagnosis not present

## 2020-03-09 DIAGNOSIS — S72002D Fracture of unspecified part of neck of left femur, subsequent encounter for closed fracture with routine healing: Secondary | ICD-10-CM | POA: Diagnosis not present

## 2020-03-09 DIAGNOSIS — I10 Essential (primary) hypertension: Secondary | ICD-10-CM | POA: Diagnosis not present

## 2020-03-09 DIAGNOSIS — C61 Malignant neoplasm of prostate: Secondary | ICD-10-CM | POA: Diagnosis not present

## 2020-03-09 DIAGNOSIS — N400 Enlarged prostate without lower urinary tract symptoms: Secondary | ICD-10-CM | POA: Diagnosis not present

## 2020-03-11 DIAGNOSIS — N400 Enlarged prostate without lower urinary tract symptoms: Secondary | ICD-10-CM | POA: Diagnosis not present

## 2020-03-11 DIAGNOSIS — C61 Malignant neoplasm of prostate: Secondary | ICD-10-CM | POA: Diagnosis not present

## 2020-03-11 DIAGNOSIS — G709 Myoneural disorder, unspecified: Secondary | ICD-10-CM | POA: Diagnosis not present

## 2020-03-11 DIAGNOSIS — I10 Essential (primary) hypertension: Secondary | ICD-10-CM | POA: Diagnosis not present

## 2020-03-11 DIAGNOSIS — W001XXD Fall from stairs and steps due to ice and snow, subsequent encounter: Secondary | ICD-10-CM | POA: Diagnosis not present

## 2020-03-11 DIAGNOSIS — R739 Hyperglycemia, unspecified: Secondary | ICD-10-CM | POA: Diagnosis not present

## 2020-03-11 DIAGNOSIS — S72002D Fracture of unspecified part of neck of left femur, subsequent encounter for closed fracture with routine healing: Secondary | ICD-10-CM | POA: Diagnosis not present

## 2020-03-11 DIAGNOSIS — R69 Illness, unspecified: Secondary | ICD-10-CM | POA: Diagnosis not present

## 2020-03-11 DIAGNOSIS — E876 Hypokalemia: Secondary | ICD-10-CM | POA: Diagnosis not present

## 2020-03-11 DIAGNOSIS — R9431 Abnormal electrocardiogram [ECG] [EKG]: Secondary | ICD-10-CM | POA: Diagnosis not present

## 2020-03-13 ENCOUNTER — Ambulatory Visit: Payer: Medicare HMO

## 2020-03-13 ENCOUNTER — Other Ambulatory Visit: Payer: Self-pay

## 2020-03-13 ENCOUNTER — Encounter: Payer: Self-pay | Admitting: Orthopedic Surgery

## 2020-03-13 ENCOUNTER — Ambulatory Visit (INDEPENDENT_AMBULATORY_CARE_PROVIDER_SITE_OTHER): Payer: Medicare HMO | Admitting: Orthopedic Surgery

## 2020-03-13 VITALS — Ht 72.0 in | Wt 210.0 lb

## 2020-03-13 DIAGNOSIS — R69 Illness, unspecified: Secondary | ICD-10-CM | POA: Diagnosis not present

## 2020-03-13 DIAGNOSIS — I10 Essential (primary) hypertension: Secondary | ICD-10-CM | POA: Diagnosis not present

## 2020-03-13 DIAGNOSIS — R739 Hyperglycemia, unspecified: Secondary | ICD-10-CM | POA: Diagnosis not present

## 2020-03-13 DIAGNOSIS — N400 Enlarged prostate without lower urinary tract symptoms: Secondary | ICD-10-CM | POA: Diagnosis not present

## 2020-03-13 DIAGNOSIS — S72002D Fracture of unspecified part of neck of left femur, subsequent encounter for closed fracture with routine healing: Secondary | ICD-10-CM | POA: Diagnosis not present

## 2020-03-13 DIAGNOSIS — G709 Myoneural disorder, unspecified: Secondary | ICD-10-CM | POA: Diagnosis not present

## 2020-03-13 DIAGNOSIS — C61 Malignant neoplasm of prostate: Secondary | ICD-10-CM | POA: Diagnosis not present

## 2020-03-13 DIAGNOSIS — S72002A Fracture of unspecified part of neck of left femur, initial encounter for closed fracture: Secondary | ICD-10-CM

## 2020-03-13 DIAGNOSIS — R972 Elevated prostate specific antigen [PSA]: Secondary | ICD-10-CM | POA: Diagnosis not present

## 2020-03-13 DIAGNOSIS — R9431 Abnormal electrocardiogram [ECG] [EKG]: Secondary | ICD-10-CM | POA: Diagnosis not present

## 2020-03-13 DIAGNOSIS — W001XXD Fall from stairs and steps due to ice and snow, subsequent encounter: Secondary | ICD-10-CM | POA: Diagnosis not present

## 2020-03-13 DIAGNOSIS — E876 Hypokalemia: Secondary | ICD-10-CM | POA: Diagnosis not present

## 2020-03-13 MED ORDER — CYCLOBENZAPRINE HCL 10 MG PO TABS: 10.0000 mg | ORAL_TABLET | Freq: Two times a day (BID) | ORAL | 1 refills | Status: AC | PRN

## 2020-03-13 NOTE — Progress Notes (Signed)
Orthopaedic Postop Note  Assessment: Kenneth Reeves is a 70 y.o. male s/p CRPP L hip  DOS: 02/27/2020  Plan: Sutures trimmed, steri strips placed XR demonstrate good healing Continue with activities as tolerated WBAT using a walker  Based on his appearance, including losing consciousness, recommend he present for evaluation regarding his general condition.  This was discussed with his daughter, who states she will take him to [place].  He has had a busy morning with multiple appointments and may be dehydrated as he has not been eating and drinking thus far this morning.   Provided a limited prescription for flexeril; continue to wean off the narcotics.    Follow-up: Return in about 4 weeks (around 04/10/2020). XR at next visit: Left hip  Subjective:  Chief Complaint  Patient presents with  . Hip Pain    Left hip pain, Patient reports his hip is worse today since the rain.     History of Present Illness: Kenneth Reeves is a 70 y.o. male who presents following the above stated procedure.  He continues to improve.  He went home following surgery.  He has been getting some assistance from home health.  Pain has been well controlled.  He uses a walker to assist with ambulation.  During evaluation and XR in clinic today, he lost consciousness and urinated on himself.  He regained consciousness quickly but continued to complain of dizziness and sweating.   Review of Systems: No fevers or chills No numbness or tingling No Chest Pain No shortness of breath   Objective: Ht 6' (1.829 m)   Wt 210 lb (95.3 kg)   BMI 28.48 kg/m   Physical Exam:  Steady walking, using a walker.  Pale after losing consciousness.  Periods of confusion.    Surgical incision healing well.  No surrounding erythema or drainage. Tolerates ROM of the left hip.  IMAGING: I personally ordered and reviewed the following images:   X-ray of the left hip, including AP of the pelvis demonstrates no interval  displacement at the fracture site.  Evidence of healing.  There is been no displacement or subsidence of the hardware.  Impression: Healing left femoral neck fracture  [name], MD 03/13/2020 12:49 PM

## 2020-03-16 DIAGNOSIS — R5383 Other fatigue: Secondary | ICD-10-CM | POA: Diagnosis not present

## 2020-03-16 DIAGNOSIS — R232 Flushing: Secondary | ICD-10-CM | POA: Diagnosis not present

## 2020-03-16 DIAGNOSIS — C61 Malignant neoplasm of prostate: Secondary | ICD-10-CM | POA: Diagnosis not present

## 2020-03-16 DIAGNOSIS — E876 Hypokalemia: Secondary | ICD-10-CM | POA: Diagnosis not present

## 2020-03-16 DIAGNOSIS — R739 Hyperglycemia, unspecified: Secondary | ICD-10-CM | POA: Diagnosis not present

## 2020-03-16 DIAGNOSIS — C772 Secondary and unspecified malignant neoplasm of intra-abdominal lymph nodes: Secondary | ICD-10-CM | POA: Diagnosis not present

## 2020-03-16 DIAGNOSIS — R69 Illness, unspecified: Secondary | ICD-10-CM | POA: Diagnosis not present

## 2020-03-16 DIAGNOSIS — R972 Elevated prostate specific antigen [PSA]: Secondary | ICD-10-CM | POA: Diagnosis not present

## 2020-03-16 DIAGNOSIS — I10 Essential (primary) hypertension: Secondary | ICD-10-CM | POA: Diagnosis not present

## 2020-03-16 DIAGNOSIS — C162 Malignant neoplasm of body of stomach: Secondary | ICD-10-CM | POA: Diagnosis not present

## 2020-03-16 DIAGNOSIS — W001XXD Fall from stairs and steps due to ice and snow, subsequent encounter: Secondary | ICD-10-CM | POA: Diagnosis not present

## 2020-03-16 DIAGNOSIS — W000XXD Fall on same level due to ice and snow, subsequent encounter: Secondary | ICD-10-CM | POA: Diagnosis not present

## 2020-03-16 DIAGNOSIS — S72002D Fracture of unspecified part of neck of left femur, subsequent encounter for closed fracture with routine healing: Secondary | ICD-10-CM | POA: Diagnosis not present

## 2020-03-16 DIAGNOSIS — R531 Weakness: Secondary | ICD-10-CM | POA: Diagnosis not present

## 2020-03-16 DIAGNOSIS — N400 Enlarged prostate without lower urinary tract symptoms: Secondary | ICD-10-CM | POA: Diagnosis not present

## 2020-03-16 DIAGNOSIS — G709 Myoneural disorder, unspecified: Secondary | ICD-10-CM | POA: Diagnosis not present

## 2020-03-16 DIAGNOSIS — R9431 Abnormal electrocardiogram [ECG] [EKG]: Secondary | ICD-10-CM | POA: Diagnosis not present

## 2020-03-17 DIAGNOSIS — W001XXD Fall from stairs and steps due to ice and snow, subsequent encounter: Secondary | ICD-10-CM | POA: Diagnosis not present

## 2020-03-17 DIAGNOSIS — S72002D Fracture of unspecified part of neck of left femur, subsequent encounter for closed fracture with routine healing: Secondary | ICD-10-CM | POA: Diagnosis not present

## 2020-03-17 DIAGNOSIS — R69 Illness, unspecified: Secondary | ICD-10-CM | POA: Diagnosis not present

## 2020-03-17 DIAGNOSIS — C61 Malignant neoplasm of prostate: Secondary | ICD-10-CM | POA: Diagnosis not present

## 2020-03-17 DIAGNOSIS — G709 Myoneural disorder, unspecified: Secondary | ICD-10-CM | POA: Diagnosis not present

## 2020-03-17 DIAGNOSIS — N400 Enlarged prostate without lower urinary tract symptoms: Secondary | ICD-10-CM | POA: Diagnosis not present

## 2020-03-17 DIAGNOSIS — R739 Hyperglycemia, unspecified: Secondary | ICD-10-CM | POA: Diagnosis not present

## 2020-03-17 DIAGNOSIS — I10 Essential (primary) hypertension: Secondary | ICD-10-CM | POA: Diagnosis not present

## 2020-03-17 DIAGNOSIS — E876 Hypokalemia: Secondary | ICD-10-CM | POA: Diagnosis not present

## 2020-03-17 DIAGNOSIS — R9431 Abnormal electrocardiogram [ECG] [EKG]: Secondary | ICD-10-CM | POA: Diagnosis not present

## 2020-03-18 DIAGNOSIS — W001XXD Fall from stairs and steps due to ice and snow, subsequent encounter: Secondary | ICD-10-CM | POA: Diagnosis not present

## 2020-03-18 DIAGNOSIS — I10 Essential (primary) hypertension: Secondary | ICD-10-CM | POA: Diagnosis not present

## 2020-03-18 DIAGNOSIS — S72002D Fracture of unspecified part of neck of left femur, subsequent encounter for closed fracture with routine healing: Secondary | ICD-10-CM | POA: Diagnosis not present

## 2020-03-18 DIAGNOSIS — R69 Illness, unspecified: Secondary | ICD-10-CM | POA: Diagnosis not present

## 2020-03-18 DIAGNOSIS — R9431 Abnormal electrocardiogram [ECG] [EKG]: Secondary | ICD-10-CM | POA: Diagnosis not present

## 2020-03-18 DIAGNOSIS — R739 Hyperglycemia, unspecified: Secondary | ICD-10-CM | POA: Diagnosis not present

## 2020-03-18 DIAGNOSIS — E876 Hypokalemia: Secondary | ICD-10-CM | POA: Diagnosis not present

## 2020-03-18 DIAGNOSIS — N400 Enlarged prostate without lower urinary tract symptoms: Secondary | ICD-10-CM | POA: Diagnosis not present

## 2020-03-18 DIAGNOSIS — C61 Malignant neoplasm of prostate: Secondary | ICD-10-CM | POA: Diagnosis not present

## 2020-03-18 DIAGNOSIS — G709 Myoneural disorder, unspecified: Secondary | ICD-10-CM | POA: Diagnosis not present

## 2020-03-20 DIAGNOSIS — N400 Enlarged prostate without lower urinary tract symptoms: Secondary | ICD-10-CM | POA: Diagnosis not present

## 2020-03-20 DIAGNOSIS — S72002D Fracture of unspecified part of neck of left femur, subsequent encounter for closed fracture with routine healing: Secondary | ICD-10-CM | POA: Diagnosis not present

## 2020-03-20 DIAGNOSIS — G709 Myoneural disorder, unspecified: Secondary | ICD-10-CM | POA: Diagnosis not present

## 2020-03-20 DIAGNOSIS — R69 Illness, unspecified: Secondary | ICD-10-CM | POA: Diagnosis not present

## 2020-03-20 DIAGNOSIS — C61 Malignant neoplasm of prostate: Secondary | ICD-10-CM | POA: Diagnosis not present

## 2020-03-20 DIAGNOSIS — I10 Essential (primary) hypertension: Secondary | ICD-10-CM | POA: Diagnosis not present

## 2020-03-20 DIAGNOSIS — E876 Hypokalemia: Secondary | ICD-10-CM | POA: Diagnosis not present

## 2020-03-20 DIAGNOSIS — R739 Hyperglycemia, unspecified: Secondary | ICD-10-CM | POA: Diagnosis not present

## 2020-03-20 DIAGNOSIS — W001XXD Fall from stairs and steps due to ice and snow, subsequent encounter: Secondary | ICD-10-CM | POA: Diagnosis not present

## 2020-03-20 DIAGNOSIS — R9431 Abnormal electrocardiogram [ECG] [EKG]: Secondary | ICD-10-CM | POA: Diagnosis not present

## 2020-03-23 DIAGNOSIS — N400 Enlarged prostate without lower urinary tract symptoms: Secondary | ICD-10-CM | POA: Diagnosis not present

## 2020-03-23 DIAGNOSIS — E876 Hypokalemia: Secondary | ICD-10-CM | POA: Diagnosis not present

## 2020-03-23 DIAGNOSIS — R69 Illness, unspecified: Secondary | ICD-10-CM | POA: Diagnosis not present

## 2020-03-23 DIAGNOSIS — S72002D Fracture of unspecified part of neck of left femur, subsequent encounter for closed fracture with routine healing: Secondary | ICD-10-CM | POA: Diagnosis not present

## 2020-03-23 DIAGNOSIS — I10 Essential (primary) hypertension: Secondary | ICD-10-CM | POA: Diagnosis not present

## 2020-03-23 DIAGNOSIS — G709 Myoneural disorder, unspecified: Secondary | ICD-10-CM | POA: Diagnosis not present

## 2020-03-23 DIAGNOSIS — R9431 Abnormal electrocardiogram [ECG] [EKG]: Secondary | ICD-10-CM | POA: Diagnosis not present

## 2020-03-23 DIAGNOSIS — R739 Hyperglycemia, unspecified: Secondary | ICD-10-CM | POA: Diagnosis not present

## 2020-03-23 DIAGNOSIS — W001XXD Fall from stairs and steps due to ice and snow, subsequent encounter: Secondary | ICD-10-CM | POA: Diagnosis not present

## 2020-03-23 DIAGNOSIS — C61 Malignant neoplasm of prostate: Secondary | ICD-10-CM | POA: Diagnosis not present

## 2020-03-24 DIAGNOSIS — E876 Hypokalemia: Secondary | ICD-10-CM | POA: Diagnosis not present

## 2020-03-24 DIAGNOSIS — R739 Hyperglycemia, unspecified: Secondary | ICD-10-CM | POA: Diagnosis not present

## 2020-03-24 DIAGNOSIS — R9431 Abnormal electrocardiogram [ECG] [EKG]: Secondary | ICD-10-CM | POA: Diagnosis not present

## 2020-03-24 DIAGNOSIS — I10 Essential (primary) hypertension: Secondary | ICD-10-CM | POA: Diagnosis not present

## 2020-03-24 DIAGNOSIS — G709 Myoneural disorder, unspecified: Secondary | ICD-10-CM | POA: Diagnosis not present

## 2020-03-24 DIAGNOSIS — C61 Malignant neoplasm of prostate: Secondary | ICD-10-CM | POA: Diagnosis not present

## 2020-03-24 DIAGNOSIS — R69 Illness, unspecified: Secondary | ICD-10-CM | POA: Diagnosis not present

## 2020-03-24 DIAGNOSIS — W001XXD Fall from stairs and steps due to ice and snow, subsequent encounter: Secondary | ICD-10-CM | POA: Diagnosis not present

## 2020-03-24 DIAGNOSIS — S72002D Fracture of unspecified part of neck of left femur, subsequent encounter for closed fracture with routine healing: Secondary | ICD-10-CM | POA: Diagnosis not present

## 2020-03-24 DIAGNOSIS — N400 Enlarged prostate without lower urinary tract symptoms: Secondary | ICD-10-CM | POA: Diagnosis not present

## 2020-04-10 ENCOUNTER — Encounter: Payer: Self-pay | Admitting: Orthopedic Surgery

## 2020-04-10 ENCOUNTER — Ambulatory Visit (INDEPENDENT_AMBULATORY_CARE_PROVIDER_SITE_OTHER): Payer: Medicare HMO | Admitting: Orthopedic Surgery

## 2020-04-10 ENCOUNTER — Other Ambulatory Visit: Payer: Self-pay

## 2020-04-10 ENCOUNTER — Ambulatory Visit: Payer: Medicare HMO

## 2020-04-10 VITALS — BP 131/89 | HR 104 | Ht 72.0 in | Wt 205.5 lb

## 2020-04-10 DIAGNOSIS — M25512 Pain in left shoulder: Secondary | ICD-10-CM

## 2020-04-10 DIAGNOSIS — S72002A Fracture of unspecified part of neck of left femur, initial encounter for closed fracture: Secondary | ICD-10-CM | POA: Diagnosis not present

## 2020-04-10 NOTE — Progress Notes (Signed)
Orthopaedic Postop Note  Assessment: Kenneth Reeves is a 70 y.o. male s/p CRPP L hip  DOS: 02/27/2020  Plan: Kenneth Reeves is healing well following surgery.  He should continue with weightbearing as tolerated on his left leg.  Recommend he continue to use the cane as needed.  Continue working on strengthening the left hip.  In regards to his left shoulder, he is point tender at the [facility] joint, and the mechanism of injury is consistent with a mild AC joint injury.  No restrictions at this time.  I have encouraged him to gradually increase his level of activity.  If he notices any further issues, he can return to clinic earlier than previously discussed.  Otherwise, I will see him back in 6 weeks.   Follow-up: Return in about 6 weeks (around 05/22/2020). XR at next visit: Left hip  Subjective:  Chief Complaint  Patient presents with  . Hip Pain    L/having some pain, been using nothing but a cane since Sunday.    History of Present Illness: Kenneth Reeves is a 70 y.o. male who presents following the above stated procedure.  He is doing well.  He feels better overall.  He is able to ambulate much easier, currently with the assistance of a cane.  No fevers or chills.  No issues with surgical incision.  He continues to have some left shoulder pain, which limits his range of motion.  It is tolerable at this time, although he is concerned that it will make his future activities more difficult.   Review of Systems: No fevers or chills No numbness or tingling No Chest Pain No shortness of breath   Objective: BP 131/89   Pulse (!) 104   Ht 6' (1.829 m)   Wt 205 lb 8 oz (93.2 kg)   BMI 27.87 kg/m   Physical Exam:  Alert and oriented, no acute distress.  He is well-appearing.  Ambulates with the assistance of a cane.  Evaluation of the left hip demonstrates a healed surgical incision, without surrounding erythema or drainage.  No tenderness to palpation.  He tolerates gentle range of motion his  left hip.  He is able to complete a straight leg raise, and maintain this position.  Active motion is intact distally.  Sensation is intact distally.  Evaluation of the left shoulder demonstrates point tenderness at the [facility] joint.  There is a mild palpable deformity in this region.  He notes pain with active abduction greater than 75 degrees.  Pain in this region with active forward elevation greater than 90 degrees.  IMAGING: I personally ordered and reviewed the following images:   X-rays of the left hip were obtained in clinic today and compared to previous imaging.  Hardware remains in stable position.  There is been no interval displacement at the fracture site.  No hardware failure is noted.  No acute injuries are noted.  Impression: Healing left femoral neck fracture status post ORIF.  [name], MD 04/10/2020 2:29 PM

## 2020-04-17 DIAGNOSIS — Z6828 Body mass index (BMI) 28.0-28.9, adult: Secondary | ICD-10-CM | POA: Diagnosis not present

## 2020-04-17 DIAGNOSIS — Z1389 Encounter for screening for other disorder: Secondary | ICD-10-CM | POA: Diagnosis not present

## 2020-04-17 DIAGNOSIS — E663 Overweight: Secondary | ICD-10-CM | POA: Diagnosis not present

## 2020-04-17 DIAGNOSIS — J449 Chronic obstructive pulmonary disease, unspecified: Secondary | ICD-10-CM | POA: Diagnosis not present

## 2020-04-17 DIAGNOSIS — I10 Essential (primary) hypertension: Secondary | ICD-10-CM | POA: Diagnosis not present

## 2020-04-17 DIAGNOSIS — Z0000 Encounter for general adult medical examination without abnormal findings: Secondary | ICD-10-CM | POA: Diagnosis not present

## 2020-05-19 ENCOUNTER — Ambulatory Visit (INDEPENDENT_AMBULATORY_CARE_PROVIDER_SITE_OTHER): Payer: Medicare HMO | Admitting: Orthopedic Surgery

## 2020-05-19 ENCOUNTER — Other Ambulatory Visit: Payer: Self-pay

## 2020-05-19 ENCOUNTER — Ambulatory Visit: Payer: Medicare HMO

## 2020-05-19 ENCOUNTER — Encounter: Payer: Self-pay | Admitting: Orthopedic Surgery

## 2020-05-19 VITALS — BP 135/68 | HR 97 | Ht 72.0 in | Wt 205.0 lb

## 2020-05-19 DIAGNOSIS — S72002A Fracture of unspecified part of neck of left femur, initial encounter for closed fracture: Secondary | ICD-10-CM

## 2020-05-19 NOTE — Progress Notes (Signed)
Orthopaedic Postop Note  Assessment: Kenneth Reeves is a 70 y.o. male s/p CRPP L hip  DOS: 02/27/2020  Plan: Continues to improve.  Strength and endurance better Using cane to assist with ambulation, but can take steps inside without assistive device Left shoulder pain improving Anticipate continued progression for up to a year after surgery. Follow up as needed If shoulder pain worsens, can return for evaluation and/or injection  Follow-up: Return if symptoms worsen or fail to improve.  Subjective:  Chief Complaint  Patient presents with  . Routine Post Op    DOS: 02/27/20 Lt displaced femoral neck fracture.     History of Present Illness: Kenneth Reeves is a 70 y.o. male who presents following the above stated procedure. He is 3 months out from surgery and doing better.  His pain has improved.  He is able to ambulate better and can walk without a cane.  No pain medications at this time.  He was complaining of left shoulder pain at the last visit, but this has improved.  Incision has healed well.   Review of Systems: No fevers or chills No numbness or tingling No Chest Pain No shortness of breath   Objective: BP 135/68   Pulse 97   Ht 6' (1.829 m)   Wt 205 lb (93 kg)   BMI 27.80 kg/m   Physical Exam:  Alert and oriented, no acute distress.  He is well-appearing.  Ambulates with the assistance of a cane.  Able to take steps without assistive device in clinic room.  Evaluation of the left hip demonstrates a well healed surgical incision.  No surrounding erythema or drainage.  Tolerates gentle range of motion in the hip.  Toes are warm and well perfused.  Active motion intact in the EHL/TA.  2+ DP pulse.  IMAGING: I personally ordered and reviewed the following images:  X-ray of the left hip was obtained in clinic today and demonstrates maintenance of alignment of his nondisplaced left femoral neck fracture.  No evidence of hardware failure or subsidence.  There is no  hardware failure.  No acute injuries are noted.  Impression: Stable left femoral neck fracture status post operative fixation.  Kenneth Rasmussen, MD 05/19/2020 2:14 PM

## 2020-05-20 ENCOUNTER — Ambulatory Visit: Payer: Medicare HMO | Admitting: Orthopedic Surgery

## 2020-06-12 DIAGNOSIS — C61 Malignant neoplasm of prostate: Secondary | ICD-10-CM | POA: Diagnosis not present

## 2020-06-15 DIAGNOSIS — E876 Hypokalemia: Secondary | ICD-10-CM | POA: Diagnosis not present

## 2020-06-15 DIAGNOSIS — C61 Malignant neoplasm of prostate: Secondary | ICD-10-CM | POA: Diagnosis not present

## 2020-06-15 DIAGNOSIS — I10 Essential (primary) hypertension: Secondary | ICD-10-CM | POA: Diagnosis not present

## 2020-06-15 DIAGNOSIS — Z192 Hormone resistant malignancy status: Secondary | ICD-10-CM | POA: Diagnosis not present

## 2020-06-15 DIAGNOSIS — Z79899 Other long term (current) drug therapy: Secondary | ICD-10-CM | POA: Diagnosis not present

## 2020-09-11 DIAGNOSIS — C61 Malignant neoplasm of prostate: Secondary | ICD-10-CM | POA: Diagnosis not present

## 2020-09-11 DIAGNOSIS — Z79899 Other long term (current) drug therapy: Secondary | ICD-10-CM | POA: Diagnosis not present

## 2020-09-11 DIAGNOSIS — R972 Elevated prostate specific antigen [PSA]: Secondary | ICD-10-CM | POA: Diagnosis not present

## 2020-09-14 DIAGNOSIS — Z79899 Other long term (current) drug therapy: Secondary | ICD-10-CM | POA: Diagnosis not present

## 2020-09-14 DIAGNOSIS — C61 Malignant neoplasm of prostate: Secondary | ICD-10-CM | POA: Diagnosis not present

## 2020-09-14 DIAGNOSIS — C772 Secondary and unspecified malignant neoplasm of intra-abdominal lymph nodes: Secondary | ICD-10-CM | POA: Diagnosis not present

## 2020-09-14 DIAGNOSIS — I959 Hypotension, unspecified: Secondary | ICD-10-CM | POA: Diagnosis not present

## 2020-09-14 DIAGNOSIS — Z192 Hormone resistant malignancy status: Secondary | ICD-10-CM | POA: Diagnosis not present

## 2020-09-14 DIAGNOSIS — R69 Illness, unspecified: Secondary | ICD-10-CM | POA: Diagnosis not present

## 2020-09-14 DIAGNOSIS — R0602 Shortness of breath: Secondary | ICD-10-CM | POA: Diagnosis not present

## 2020-09-14 DIAGNOSIS — R232 Flushing: Secondary | ICD-10-CM | POA: Diagnosis not present

## 2020-09-14 DIAGNOSIS — I10 Essential (primary) hypertension: Secondary | ICD-10-CM | POA: Diagnosis not present

## 2020-09-14 DIAGNOSIS — R5383 Other fatigue: Secondary | ICD-10-CM | POA: Diagnosis not present

## 2020-10-01 DIAGNOSIS — C778 Secondary and unspecified malignant neoplasm of lymph nodes of multiple regions: Secondary | ICD-10-CM | POA: Diagnosis not present

## 2020-10-01 DIAGNOSIS — C61 Malignant neoplasm of prostate: Secondary | ICD-10-CM | POA: Diagnosis not present

## 2020-10-01 DIAGNOSIS — Z192 Hormone resistant malignancy status: Secondary | ICD-10-CM | POA: Diagnosis not present

## 2020-10-08 DIAGNOSIS — C61 Malignant neoplasm of prostate: Secondary | ICD-10-CM | POA: Diagnosis not present

## 2020-10-23 DIAGNOSIS — R972 Elevated prostate specific antigen [PSA]: Secondary | ICD-10-CM | POA: Diagnosis not present

## 2020-10-23 DIAGNOSIS — C61 Malignant neoplasm of prostate: Secondary | ICD-10-CM | POA: Diagnosis not present

## 2020-10-26 DIAGNOSIS — Z9221 Personal history of antineoplastic chemotherapy: Secondary | ICD-10-CM | POA: Diagnosis not present

## 2020-10-26 DIAGNOSIS — R0602 Shortness of breath: Secondary | ICD-10-CM | POA: Diagnosis not present

## 2020-10-26 DIAGNOSIS — I10 Essential (primary) hypertension: Secondary | ICD-10-CM | POA: Diagnosis not present

## 2020-10-26 DIAGNOSIS — Z79818 Long term (current) use of other agents affecting estrogen receptors and estrogen levels: Secondary | ICD-10-CM | POA: Diagnosis not present

## 2020-10-26 DIAGNOSIS — Z79899 Other long term (current) drug therapy: Secondary | ICD-10-CM | POA: Diagnosis not present

## 2020-10-26 DIAGNOSIS — R232 Flushing: Secondary | ICD-10-CM | POA: Diagnosis not present

## 2020-10-26 DIAGNOSIS — R9721 Rising PSA following treatment for malignant neoplasm of prostate: Secondary | ICD-10-CM | POA: Diagnosis not present

## 2020-10-26 DIAGNOSIS — Z192 Hormone resistant malignancy status: Secondary | ICD-10-CM | POA: Diagnosis not present

## 2020-10-26 DIAGNOSIS — C61 Malignant neoplasm of prostate: Secondary | ICD-10-CM | POA: Diagnosis not present

## 2020-10-27 DIAGNOSIS — C61 Malignant neoplasm of prostate: Secondary | ICD-10-CM | POA: Diagnosis not present

## 2020-10-27 DIAGNOSIS — J449 Chronic obstructive pulmonary disease, unspecified: Secondary | ICD-10-CM | POA: Diagnosis not present

## 2020-10-27 DIAGNOSIS — I10 Essential (primary) hypertension: Secondary | ICD-10-CM | POA: Diagnosis not present

## 2020-10-27 DIAGNOSIS — K219 Gastro-esophageal reflux disease without esophagitis: Secondary | ICD-10-CM | POA: Diagnosis not present

## 2020-10-27 DIAGNOSIS — Z1331 Encounter for screening for depression: Secondary | ICD-10-CM | POA: Diagnosis not present

## 2020-10-27 DIAGNOSIS — R7309 Other abnormal glucose: Secondary | ICD-10-CM | POA: Diagnosis not present

## 2020-10-27 DIAGNOSIS — E785 Hyperlipidemia, unspecified: Secondary | ICD-10-CM | POA: Diagnosis not present

## 2020-11-24 DIAGNOSIS — H6123 Impacted cerumen, bilateral: Secondary | ICD-10-CM | POA: Diagnosis not present

## 2020-11-24 DIAGNOSIS — C61 Malignant neoplasm of prostate: Secondary | ICD-10-CM | POA: Diagnosis not present

## 2020-11-27 DIAGNOSIS — C772 Secondary and unspecified malignant neoplasm of intra-abdominal lymph nodes: Secondary | ICD-10-CM | POA: Diagnosis not present

## 2020-11-27 DIAGNOSIS — C61 Malignant neoplasm of prostate: Secondary | ICD-10-CM | POA: Diagnosis not present

## 2020-12-01 DIAGNOSIS — H6122 Impacted cerumen, left ear: Secondary | ICD-10-CM | POA: Diagnosis not present

## 2020-12-14 DIAGNOSIS — H903 Sensorineural hearing loss, bilateral: Secondary | ICD-10-CM | POA: Diagnosis not present

## 2020-12-23 DIAGNOSIS — Z192 Hormone resistant malignancy status: Secondary | ICD-10-CM | POA: Diagnosis not present

## 2020-12-23 DIAGNOSIS — R0602 Shortness of breath: Secondary | ICD-10-CM | POA: Diagnosis not present

## 2020-12-23 DIAGNOSIS — I10 Essential (primary) hypertension: Secondary | ICD-10-CM | POA: Diagnosis not present

## 2020-12-23 DIAGNOSIS — R232 Flushing: Secondary | ICD-10-CM | POA: Diagnosis not present

## 2020-12-23 DIAGNOSIS — C61 Malignant neoplasm of prostate: Secondary | ICD-10-CM | POA: Diagnosis not present

## 2020-12-23 DIAGNOSIS — Z87891 Personal history of nicotine dependence: Secondary | ICD-10-CM | POA: Diagnosis not present

## 2021-01-13 DIAGNOSIS — C61 Malignant neoplasm of prostate: Secondary | ICD-10-CM | POA: Diagnosis not present

## 2021-01-13 DIAGNOSIS — C772 Secondary and unspecified malignant neoplasm of intra-abdominal lymph nodes: Secondary | ICD-10-CM | POA: Diagnosis not present

## 2021-01-15 DIAGNOSIS — C772 Secondary and unspecified malignant neoplasm of intra-abdominal lymph nodes: Secondary | ICD-10-CM | POA: Diagnosis not present

## 2021-01-15 DIAGNOSIS — C61 Malignant neoplasm of prostate: Secondary | ICD-10-CM | POA: Diagnosis not present

## 2021-02-12 DIAGNOSIS — C61 Malignant neoplasm of prostate: Secondary | ICD-10-CM | POA: Diagnosis not present

## 2021-02-12 DIAGNOSIS — C772 Secondary and unspecified malignant neoplasm of intra-abdominal lymph nodes: Secondary | ICD-10-CM | POA: Diagnosis not present

## 2021-03-05 DIAGNOSIS — C772 Secondary and unspecified malignant neoplasm of intra-abdominal lymph nodes: Secondary | ICD-10-CM | POA: Diagnosis not present

## 2021-03-05 DIAGNOSIS — C61 Malignant neoplasm of prostate: Secondary | ICD-10-CM | POA: Diagnosis not present

## 2021-03-29 DIAGNOSIS — R0602 Shortness of breath: Secondary | ICD-10-CM | POA: Diagnosis not present

## 2021-03-29 DIAGNOSIS — R5383 Other fatigue: Secondary | ICD-10-CM | POA: Diagnosis not present

## 2021-03-29 DIAGNOSIS — Z192 Hormone resistant malignancy status: Secondary | ICD-10-CM | POA: Diagnosis not present

## 2021-03-29 DIAGNOSIS — R232 Flushing: Secondary | ICD-10-CM | POA: Diagnosis not present

## 2021-03-29 DIAGNOSIS — R972 Elevated prostate specific antigen [PSA]: Secondary | ICD-10-CM | POA: Diagnosis not present

## 2021-03-29 DIAGNOSIS — T386X5A Adverse effect of antigonadotrophins, antiestrogens, antiandrogens, not elsewhere classified, initial encounter: Secondary | ICD-10-CM | POA: Diagnosis not present

## 2021-03-29 DIAGNOSIS — Z87891 Personal history of nicotine dependence: Secondary | ICD-10-CM | POA: Diagnosis not present

## 2021-03-29 DIAGNOSIS — C772 Secondary and unspecified malignant neoplasm of intra-abdominal lymph nodes: Secondary | ICD-10-CM | POA: Diagnosis not present

## 2021-03-29 DIAGNOSIS — R69 Illness, unspecified: Secondary | ICD-10-CM | POA: Diagnosis not present

## 2021-03-29 DIAGNOSIS — C61 Malignant neoplasm of prostate: Secondary | ICD-10-CM | POA: Diagnosis not present

## 2021-03-29 DIAGNOSIS — I10 Essential (primary) hypertension: Secondary | ICD-10-CM | POA: Diagnosis not present

## 2021-04-16 DIAGNOSIS — C772 Secondary and unspecified malignant neoplasm of intra-abdominal lymph nodes: Secondary | ICD-10-CM | POA: Diagnosis not present

## 2021-04-16 DIAGNOSIS — C61 Malignant neoplasm of prostate: Secondary | ICD-10-CM | POA: Diagnosis not present

## 2021-05-13 DIAGNOSIS — C61 Malignant neoplasm of prostate: Secondary | ICD-10-CM | POA: Diagnosis not present

## 2021-05-14 DIAGNOSIS — C61 Malignant neoplasm of prostate: Secondary | ICD-10-CM | POA: Diagnosis not present

## 2021-05-14 DIAGNOSIS — C772 Secondary and unspecified malignant neoplasm of intra-abdominal lymph nodes: Secondary | ICD-10-CM | POA: Diagnosis not present

## 2021-05-24 DIAGNOSIS — R232 Flushing: Secondary | ICD-10-CM | POA: Diagnosis not present

## 2021-05-24 DIAGNOSIS — I10 Essential (primary) hypertension: Secondary | ICD-10-CM | POA: Diagnosis not present

## 2021-05-24 DIAGNOSIS — C61 Malignant neoplasm of prostate: Secondary | ICD-10-CM | POA: Diagnosis not present

## 2021-05-24 DIAGNOSIS — Z192 Hormone resistant malignancy status: Secondary | ICD-10-CM | POA: Diagnosis not present

## 2021-05-24 DIAGNOSIS — R0602 Shortness of breath: Secondary | ICD-10-CM | POA: Diagnosis not present

## 2021-05-24 DIAGNOSIS — R972 Elevated prostate specific antigen [PSA]: Secondary | ICD-10-CM | POA: Diagnosis not present

## 2021-05-24 DIAGNOSIS — Z87891 Personal history of nicotine dependence: Secondary | ICD-10-CM | POA: Diagnosis not present

## 2021-05-24 DIAGNOSIS — C772 Secondary and unspecified malignant neoplasm of intra-abdominal lymph nodes: Secondary | ICD-10-CM | POA: Diagnosis not present

## 2021-05-28 DIAGNOSIS — C61 Malignant neoplasm of prostate: Secondary | ICD-10-CM | POA: Diagnosis not present

## 2021-05-28 DIAGNOSIS — C772 Secondary and unspecified malignant neoplasm of intra-abdominal lymph nodes: Secondary | ICD-10-CM | POA: Diagnosis not present

## 2021-06-24 DIAGNOSIS — C61 Malignant neoplasm of prostate: Secondary | ICD-10-CM | POA: Diagnosis not present

## 2021-06-24 DIAGNOSIS — C772 Secondary and unspecified malignant neoplasm of intra-abdominal lymph nodes: Secondary | ICD-10-CM | POA: Diagnosis not present

## 2021-06-25 DIAGNOSIS — C61 Malignant neoplasm of prostate: Secondary | ICD-10-CM | POA: Diagnosis not present

## 2021-06-25 DIAGNOSIS — C772 Secondary and unspecified malignant neoplasm of intra-abdominal lymph nodes: Secondary | ICD-10-CM | POA: Diagnosis not present

## 2021-07-09 DIAGNOSIS — C772 Secondary and unspecified malignant neoplasm of intra-abdominal lymph nodes: Secondary | ICD-10-CM | POA: Diagnosis not present

## 2021-07-09 DIAGNOSIS — C61 Malignant neoplasm of prostate: Secondary | ICD-10-CM | POA: Diagnosis not present

## 2021-07-19 DIAGNOSIS — Z79899 Other long term (current) drug therapy: Secondary | ICD-10-CM | POA: Diagnosis not present

## 2021-07-19 DIAGNOSIS — R232 Flushing: Secondary | ICD-10-CM | POA: Diagnosis not present

## 2021-07-19 DIAGNOSIS — I10 Essential (primary) hypertension: Secondary | ICD-10-CM | POA: Diagnosis not present

## 2021-07-19 DIAGNOSIS — Z923 Personal history of irradiation: Secondary | ICD-10-CM | POA: Diagnosis not present

## 2021-07-19 DIAGNOSIS — R0602 Shortness of breath: Secondary | ICD-10-CM | POA: Diagnosis not present

## 2021-07-19 DIAGNOSIS — C61 Malignant neoplasm of prostate: Secondary | ICD-10-CM | POA: Diagnosis not present

## 2021-07-19 DIAGNOSIS — Z87891 Personal history of nicotine dependence: Secondary | ICD-10-CM | POA: Diagnosis not present

## 2021-07-19 DIAGNOSIS — Z192 Hormone resistant malignancy status: Secondary | ICD-10-CM | POA: Diagnosis not present

## 2021-08-04 DIAGNOSIS — C61 Malignant neoplasm of prostate: Secondary | ICD-10-CM | POA: Diagnosis not present

## 2021-08-05 DIAGNOSIS — C61 Malignant neoplasm of prostate: Secondary | ICD-10-CM | POA: Diagnosis not present

## 2021-09-06 DIAGNOSIS — I714 Abdominal aortic aneurysm, without rupture, unspecified: Secondary | ICD-10-CM | POA: Diagnosis not present

## 2021-09-06 DIAGNOSIS — C61 Malignant neoplasm of prostate: Secondary | ICD-10-CM | POA: Diagnosis not present

## 2021-09-13 DIAGNOSIS — I10 Essential (primary) hypertension: Secondary | ICD-10-CM | POA: Diagnosis not present

## 2021-09-13 DIAGNOSIS — R69 Illness, unspecified: Secondary | ICD-10-CM | POA: Diagnosis not present

## 2021-09-13 DIAGNOSIS — R232 Flushing: Secondary | ICD-10-CM | POA: Diagnosis not present

## 2021-09-13 DIAGNOSIS — Z192 Hormone resistant malignancy status: Secondary | ICD-10-CM | POA: Diagnosis not present

## 2021-09-13 DIAGNOSIS — R972 Elevated prostate specific antigen [PSA]: Secondary | ICD-10-CM | POA: Diagnosis not present

## 2021-09-13 DIAGNOSIS — R0602 Shortness of breath: Secondary | ICD-10-CM | POA: Diagnosis not present

## 2021-09-13 DIAGNOSIS — R197 Diarrhea, unspecified: Secondary | ICD-10-CM | POA: Diagnosis not present

## 2021-09-13 DIAGNOSIS — C61 Malignant neoplasm of prostate: Secondary | ICD-10-CM | POA: Diagnosis not present

## 2021-09-13 DIAGNOSIS — I714 Abdominal aortic aneurysm, without rupture, unspecified: Secondary | ICD-10-CM | POA: Diagnosis not present

## 2021-09-13 DIAGNOSIS — Z79899 Other long term (current) drug therapy: Secondary | ICD-10-CM | POA: Diagnosis not present

## 2021-09-13 DIAGNOSIS — C772 Secondary and unspecified malignant neoplasm of intra-abdominal lymph nodes: Secondary | ICD-10-CM | POA: Diagnosis not present

## 2021-09-13 DIAGNOSIS — Z79818 Long term (current) use of other agents affecting estrogen receptors and estrogen levels: Secondary | ICD-10-CM | POA: Diagnosis not present

## 2021-09-21 DIAGNOSIS — R0902 Hypoxemia: Secondary | ICD-10-CM | POA: Diagnosis not present

## 2021-09-21 DIAGNOSIS — R69 Illness, unspecified: Secondary | ICD-10-CM | POA: Diagnosis not present

## 2021-09-21 DIAGNOSIS — R197 Diarrhea, unspecified: Secondary | ICD-10-CM | POA: Diagnosis not present

## 2021-09-21 DIAGNOSIS — K5900 Constipation, unspecified: Secondary | ICD-10-CM | POA: Diagnosis not present

## 2021-09-21 DIAGNOSIS — D4959 Neoplasm of unspecified behavior of other genitourinary organ: Secondary | ICD-10-CM | POA: Diagnosis not present

## 2021-09-27 DIAGNOSIS — R197 Diarrhea, unspecified: Secondary | ICD-10-CM | POA: Diagnosis not present

## 2021-09-30 DIAGNOSIS — Z8546 Personal history of malignant neoplasm of prostate: Secondary | ICD-10-CM | POA: Diagnosis not present

## 2021-09-30 DIAGNOSIS — I10 Essential (primary) hypertension: Secondary | ICD-10-CM | POA: Diagnosis not present

## 2021-09-30 DIAGNOSIS — I77810 Thoracic aortic ectasia: Secondary | ICD-10-CM | POA: Diagnosis not present

## 2021-09-30 DIAGNOSIS — R69 Illness, unspecified: Secondary | ICD-10-CM | POA: Diagnosis not present

## 2021-09-30 DIAGNOSIS — I7121 Aneurysm of the ascending aorta, without rupture: Secondary | ICD-10-CM | POA: Diagnosis not present

## 2021-09-30 DIAGNOSIS — Z9221 Personal history of antineoplastic chemotherapy: Secondary | ICD-10-CM | POA: Diagnosis not present

## 2021-10-15 DIAGNOSIS — I10 Essential (primary) hypertension: Secondary | ICD-10-CM | POA: Diagnosis not present

## 2021-10-15 DIAGNOSIS — Z0001 Encounter for general adult medical examination with abnormal findings: Secondary | ICD-10-CM | POA: Diagnosis not present

## 2021-10-15 DIAGNOSIS — Z1331 Encounter for screening for depression: Secondary | ICD-10-CM | POA: Diagnosis not present

## 2021-10-15 DIAGNOSIS — M159 Polyosteoarthritis, unspecified: Secondary | ICD-10-CM | POA: Diagnosis not present

## 2021-10-15 DIAGNOSIS — C61 Malignant neoplasm of prostate: Secondary | ICD-10-CM | POA: Diagnosis not present

## 2021-10-15 DIAGNOSIS — E785 Hyperlipidemia, unspecified: Secondary | ICD-10-CM | POA: Diagnosis not present

## 2021-10-15 DIAGNOSIS — E663 Overweight: Secondary | ICD-10-CM | POA: Diagnosis not present

## 2021-10-15 DIAGNOSIS — Z6825 Body mass index (BMI) 25.0-25.9, adult: Secondary | ICD-10-CM | POA: Diagnosis not present

## 2021-10-15 DIAGNOSIS — J449 Chronic obstructive pulmonary disease, unspecified: Secondary | ICD-10-CM | POA: Diagnosis not present

## 2021-10-15 DIAGNOSIS — Z23 Encounter for immunization: Secondary | ICD-10-CM | POA: Diagnosis not present

## 2021-10-15 DIAGNOSIS — E782 Mixed hyperlipidemia: Secondary | ICD-10-CM | POA: Diagnosis not present

## 2021-10-19 DIAGNOSIS — R194 Change in bowel habit: Secondary | ICD-10-CM | POA: Diagnosis not present

## 2021-10-19 DIAGNOSIS — R195 Other fecal abnormalities: Secondary | ICD-10-CM | POA: Diagnosis not present

## 2021-10-27 IMAGING — DX DG HIP (WITH OR WITHOUT PELVIS) 2-3V*L*
3 series · 3 of 3 positions shown · non-contrast
Comparison: X-ray pelvis 11/20/2013.

CLINICAL DATA: fell 3 steps hitting his left hip.

EXAM:
DG HIP (WITH OR WITHOUT PELVIS) 2-3V LEFT

[pelvis ap]
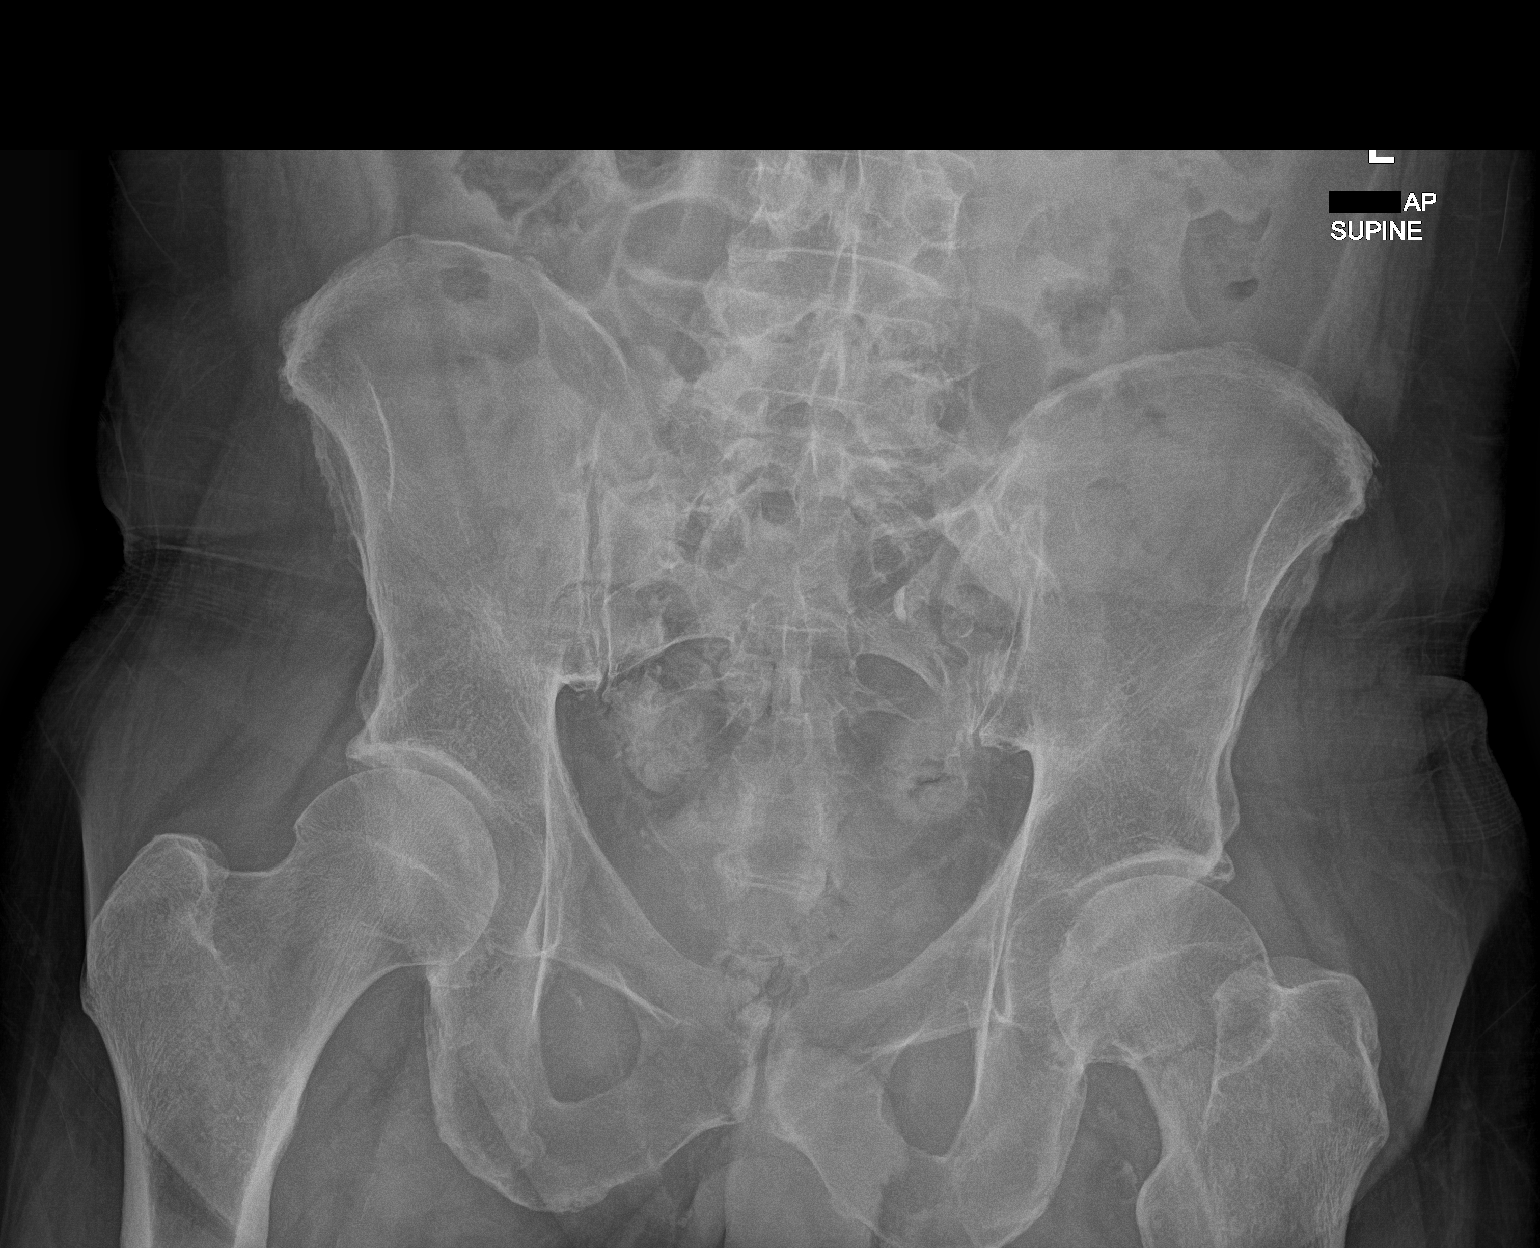

[hip ap]
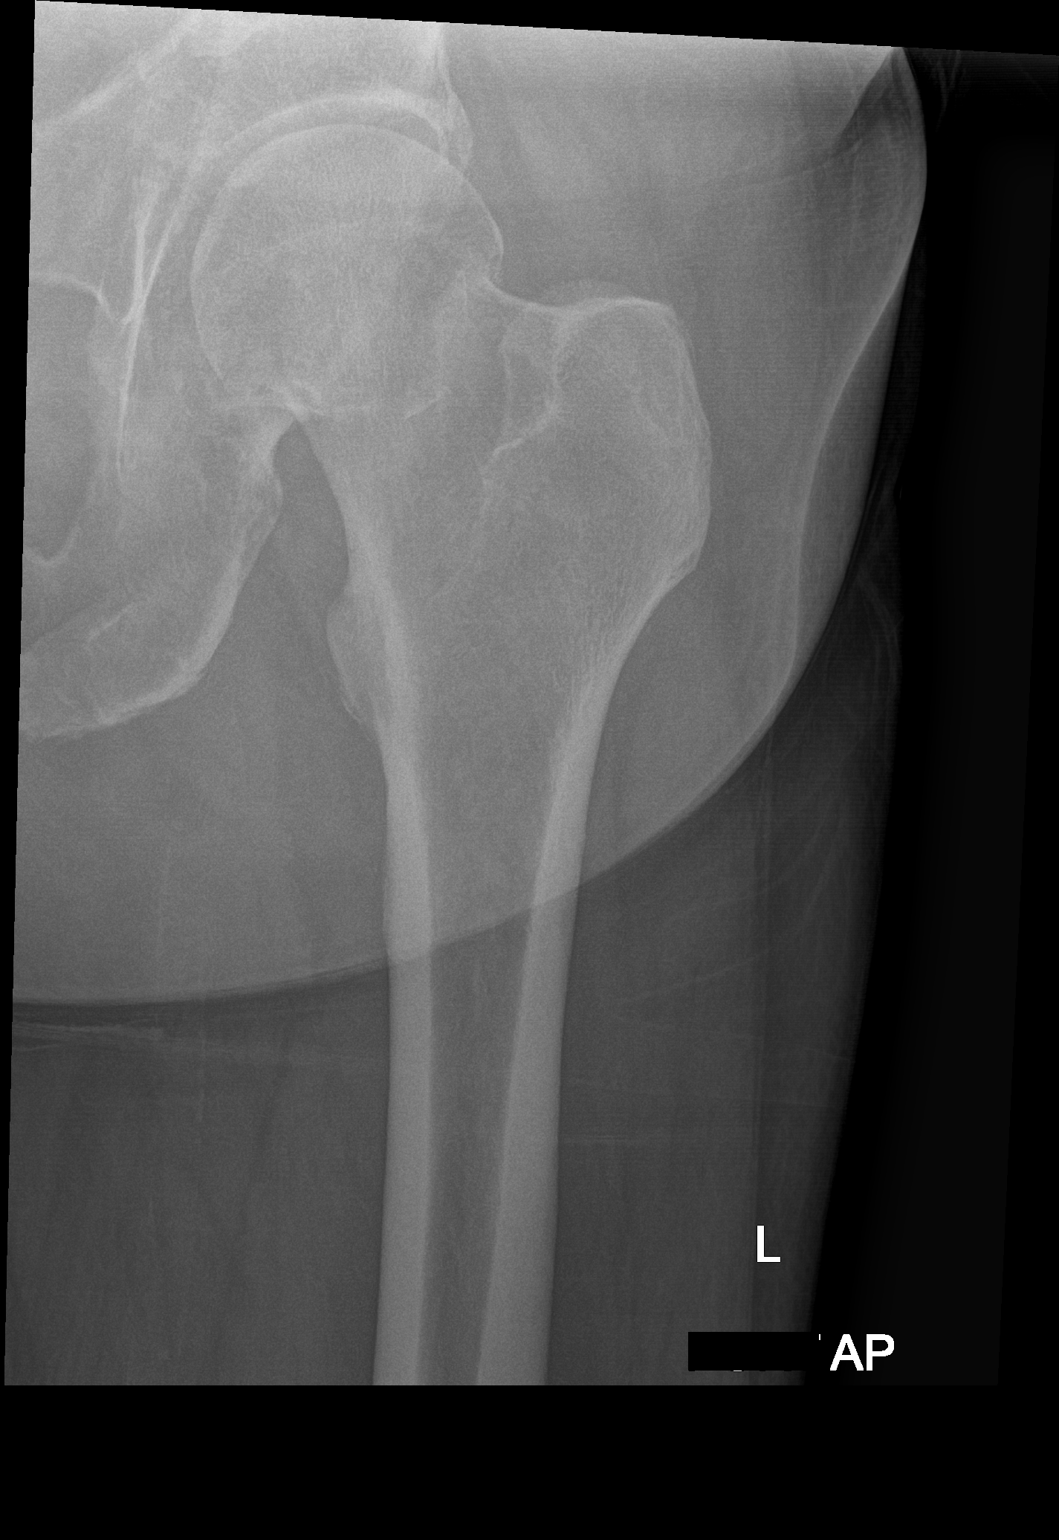

[hip lat]
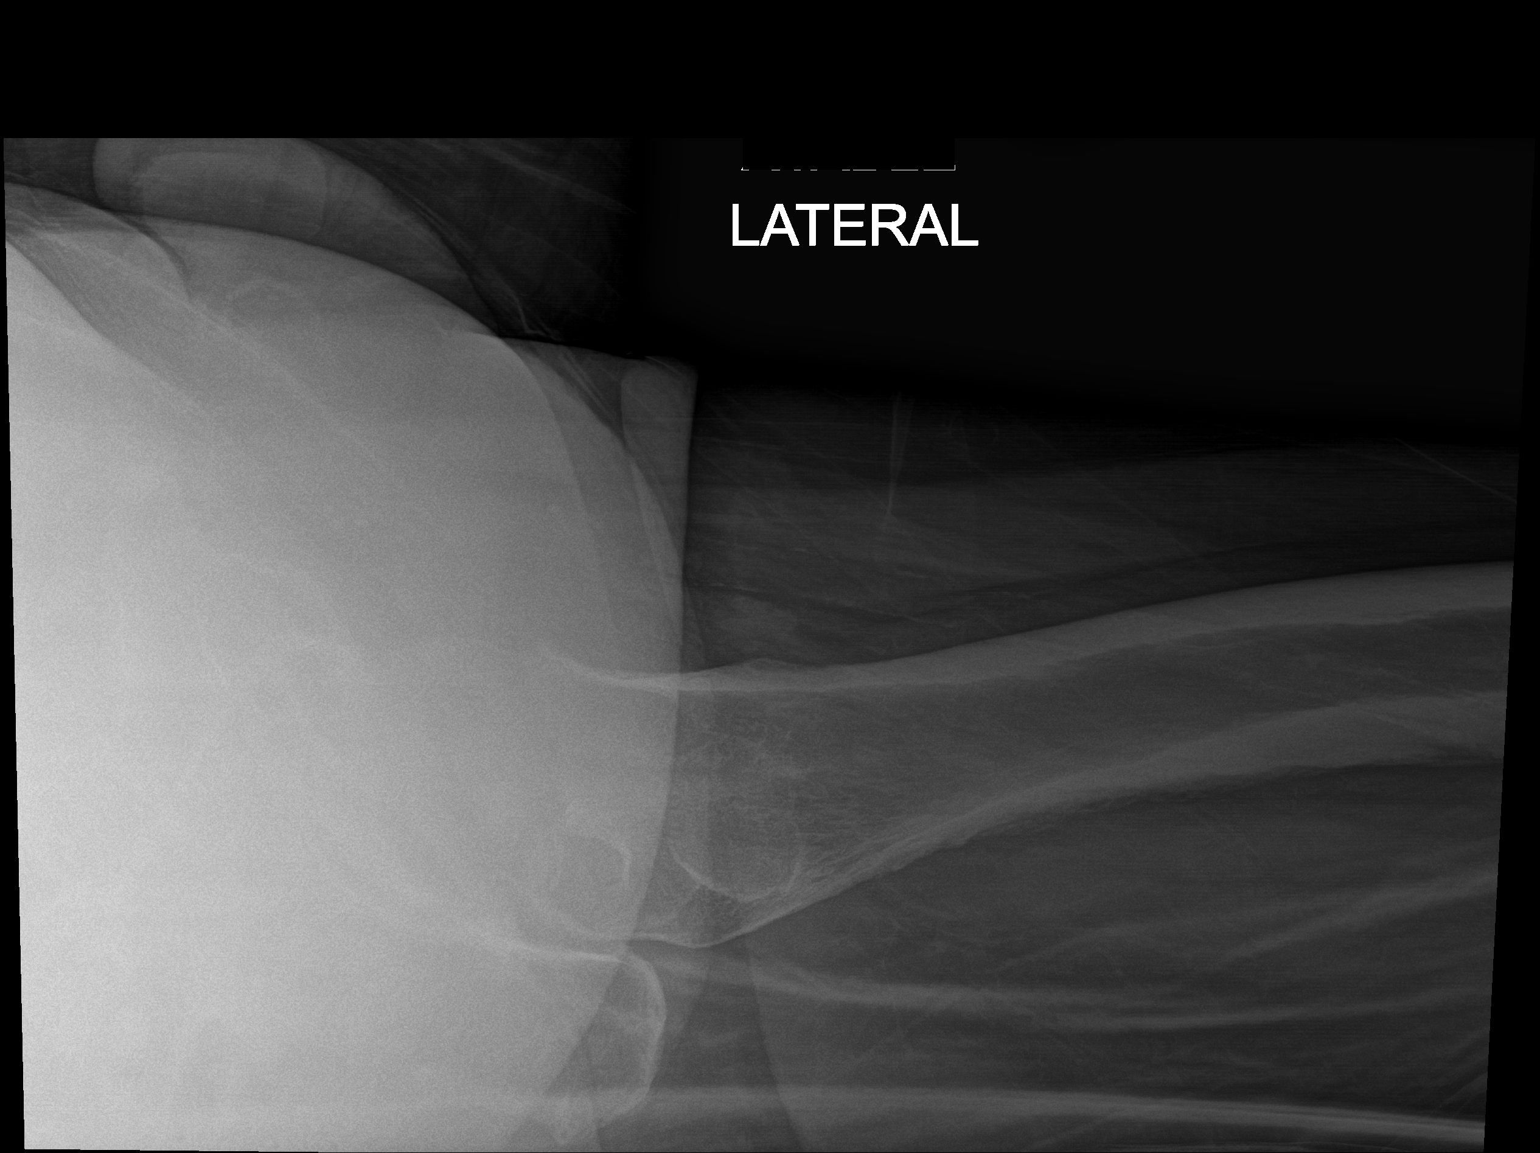

[3 of 3 positions shown; findings below may reference images not displayed]

FINDINGS: Subcapital versus impacted transcervical left femoral neck fracture.
Frontal view of the pelvis and right hip are grossly unremarkable.
There is no evidence of severe arthropathy or other focal bone
abnormality. Subcutaneus soft tissue are grossly unremarkable.
Vascular calcifications.
IMPRESSION: Subcapital versus impacted transcervical left femoral neck fracture.

## 2021-11-01 DIAGNOSIS — I77819 Aortic ectasia, unspecified site: Secondary | ICD-10-CM | POA: Diagnosis not present

## 2021-11-01 DIAGNOSIS — I361 Nonrheumatic tricuspid (valve) insufficiency: Secondary | ICD-10-CM | POA: Diagnosis not present

## 2021-11-01 DIAGNOSIS — I517 Cardiomegaly: Secondary | ICD-10-CM | POA: Diagnosis not present

## 2021-12-13 DIAGNOSIS — Z192 Hormone resistant malignancy status: Secondary | ICD-10-CM | POA: Diagnosis not present

## 2021-12-13 DIAGNOSIS — R0602 Shortness of breath: Secondary | ICD-10-CM | POA: Diagnosis not present

## 2021-12-13 DIAGNOSIS — T38895A Adverse effect of other hormones and synthetic substitutes, initial encounter: Secondary | ICD-10-CM | POA: Diagnosis not present

## 2021-12-13 DIAGNOSIS — C61 Malignant neoplasm of prostate: Secondary | ICD-10-CM | POA: Diagnosis not present

## 2021-12-13 DIAGNOSIS — I714 Abdominal aortic aneurysm, without rupture, unspecified: Secondary | ICD-10-CM | POA: Diagnosis not present

## 2021-12-13 DIAGNOSIS — R232 Flushing: Secondary | ICD-10-CM | POA: Diagnosis not present

## 2021-12-13 DIAGNOSIS — I10 Essential (primary) hypertension: Secondary | ICD-10-CM | POA: Diagnosis not present

## 2022-01-03 DIAGNOSIS — C61 Malignant neoplasm of prostate: Secondary | ICD-10-CM | POA: Diagnosis not present

## 2022-01-07 DIAGNOSIS — N410 Acute prostatitis: Secondary | ICD-10-CM | POA: Diagnosis not present

## 2022-01-07 DIAGNOSIS — R338 Other retention of urine: Secondary | ICD-10-CM | POA: Diagnosis not present

## 2022-01-07 DIAGNOSIS — R3911 Hesitancy of micturition: Secondary | ICD-10-CM | POA: Diagnosis not present

## 2022-01-07 DIAGNOSIS — C61 Malignant neoplasm of prostate: Secondary | ICD-10-CM | POA: Diagnosis not present

## 2022-01-07 DIAGNOSIS — R3915 Urgency of urination: Secondary | ICD-10-CM | POA: Diagnosis not present

## 2022-01-07 DIAGNOSIS — R3912 Poor urinary stream: Secondary | ICD-10-CM | POA: Diagnosis not present

## 2022-01-14 DIAGNOSIS — R3911 Hesitancy of micturition: Secondary | ICD-10-CM | POA: Diagnosis not present

## 2022-01-14 DIAGNOSIS — R399 Unspecified symptoms and signs involving the genitourinary system: Secondary | ICD-10-CM | POA: Diagnosis not present

## 2022-01-14 DIAGNOSIS — C61 Malignant neoplasm of prostate: Secondary | ICD-10-CM | POA: Diagnosis not present

## 2022-01-14 DIAGNOSIS — R3912 Poor urinary stream: Secondary | ICD-10-CM | POA: Diagnosis not present

## 2022-01-14 DIAGNOSIS — R3915 Urgency of urination: Secondary | ICD-10-CM | POA: Diagnosis not present

## 2022-01-14 DIAGNOSIS — R339 Retention of urine, unspecified: Secondary | ICD-10-CM | POA: Diagnosis not present

## 2022-01-17 DIAGNOSIS — R339 Retention of urine, unspecified: Secondary | ICD-10-CM | POA: Diagnosis not present

## 2022-01-21 DIAGNOSIS — C61 Malignant neoplasm of prostate: Secondary | ICD-10-CM | POA: Diagnosis not present

## 2022-01-26 DIAGNOSIS — K621 Rectal polyp: Secondary | ICD-10-CM | POA: Diagnosis not present

## 2022-01-26 DIAGNOSIS — Z1211 Encounter for screening for malignant neoplasm of colon: Secondary | ICD-10-CM | POA: Diagnosis not present

## 2022-01-26 DIAGNOSIS — R194 Change in bowel habit: Secondary | ICD-10-CM | POA: Diagnosis not present

## 2022-01-26 DIAGNOSIS — R195 Other fecal abnormalities: Secondary | ICD-10-CM | POA: Diagnosis not present

## 2022-01-26 DIAGNOSIS — D128 Benign neoplasm of rectum: Secondary | ICD-10-CM | POA: Diagnosis not present

## 2022-01-26 DIAGNOSIS — D125 Benign neoplasm of sigmoid colon: Secondary | ICD-10-CM | POA: Diagnosis not present

## 2022-01-26 DIAGNOSIS — K648 Other hemorrhoids: Secondary | ICD-10-CM | POA: Diagnosis not present

## 2022-01-26 DIAGNOSIS — D124 Benign neoplasm of descending colon: Secondary | ICD-10-CM | POA: Diagnosis not present

## 2022-01-27 DIAGNOSIS — R972 Elevated prostate specific antigen [PSA]: Secondary | ICD-10-CM | POA: Diagnosis not present

## 2022-01-27 DIAGNOSIS — C61 Malignant neoplasm of prostate: Secondary | ICD-10-CM | POA: Diagnosis not present

## 2022-01-27 DIAGNOSIS — R339 Retention of urine, unspecified: Secondary | ICD-10-CM | POA: Diagnosis not present

## 2022-02-02 DIAGNOSIS — I10 Essential (primary) hypertension: Secondary | ICD-10-CM | POA: Diagnosis not present

## 2022-02-02 DIAGNOSIS — M898X0 Other specified disorders of bone, multiple sites: Secondary | ICD-10-CM | POA: Diagnosis not present

## 2022-02-02 DIAGNOSIS — R0602 Shortness of breath: Secondary | ICD-10-CM | POA: Diagnosis not present

## 2022-02-02 DIAGNOSIS — Z192 Hormone resistant malignancy status: Secondary | ICD-10-CM | POA: Diagnosis not present

## 2022-02-02 DIAGNOSIS — C61 Malignant neoplasm of prostate: Secondary | ICD-10-CM | POA: Diagnosis not present

## 2022-02-02 DIAGNOSIS — C7951 Secondary malignant neoplasm of bone: Secondary | ICD-10-CM | POA: Diagnosis not present

## 2022-02-02 DIAGNOSIS — I714 Abdominal aortic aneurysm, without rupture, unspecified: Secondary | ICD-10-CM | POA: Diagnosis not present

## 2022-02-02 DIAGNOSIS — C772 Secondary and unspecified malignant neoplasm of intra-abdominal lymph nodes: Secondary | ICD-10-CM | POA: Diagnosis not present

## 2022-02-02 DIAGNOSIS — R232 Flushing: Secondary | ICD-10-CM | POA: Diagnosis not present

## 2022-02-02 DIAGNOSIS — Z9229 Personal history of other drug therapy: Secondary | ICD-10-CM | POA: Diagnosis not present

## 2022-02-16 DIAGNOSIS — R339 Retention of urine, unspecified: Secondary | ICD-10-CM | POA: Diagnosis not present

## 2022-02-23 DIAGNOSIS — Z9221 Personal history of antineoplastic chemotherapy: Secondary | ICD-10-CM | POA: Diagnosis not present

## 2022-02-23 DIAGNOSIS — C61 Malignant neoplasm of prostate: Secondary | ICD-10-CM | POA: Diagnosis not present

## 2022-02-24 DIAGNOSIS — C772 Secondary and unspecified malignant neoplasm of intra-abdominal lymph nodes: Secondary | ICD-10-CM | POA: Diagnosis not present

## 2022-02-24 DIAGNOSIS — N401 Enlarged prostate with lower urinary tract symptoms: Secondary | ICD-10-CM | POA: Diagnosis not present

## 2022-02-24 DIAGNOSIS — R338 Other retention of urine: Secondary | ICD-10-CM | POA: Diagnosis not present

## 2022-02-24 DIAGNOSIS — C61 Malignant neoplasm of prostate: Secondary | ICD-10-CM | POA: Diagnosis not present

## 2022-02-24 DIAGNOSIS — N3289 Other specified disorders of bladder: Secondary | ICD-10-CM | POA: Diagnosis not present

## 2022-02-28 DIAGNOSIS — Z79818 Long term (current) use of other agents affecting estrogen receptors and estrogen levels: Secondary | ICD-10-CM | POA: Diagnosis not present

## 2022-02-28 DIAGNOSIS — C778 Secondary and unspecified malignant neoplasm of lymph nodes of multiple regions: Secondary | ICD-10-CM | POA: Diagnosis not present

## 2022-02-28 DIAGNOSIS — Y92531 Health care provider office as the place of occurrence of the external cause: Secondary | ICD-10-CM | POA: Diagnosis not present

## 2022-02-28 DIAGNOSIS — C772 Secondary and unspecified malignant neoplasm of intra-abdominal lymph nodes: Secondary | ICD-10-CM | POA: Diagnosis not present

## 2022-02-28 DIAGNOSIS — T386X5A Adverse effect of antigonadotrophins, antiestrogens, antiandrogens, not elsewhere classified, initial encounter: Secondary | ICD-10-CM | POA: Diagnosis not present

## 2022-02-28 DIAGNOSIS — R232 Flushing: Secondary | ICD-10-CM | POA: Diagnosis not present

## 2022-02-28 DIAGNOSIS — I714 Abdominal aortic aneurysm, without rupture, unspecified: Secondary | ICD-10-CM | POA: Diagnosis not present

## 2022-02-28 DIAGNOSIS — R0602 Shortness of breath: Secondary | ICD-10-CM | POA: Diagnosis not present

## 2022-02-28 DIAGNOSIS — Z9221 Personal history of antineoplastic chemotherapy: Secondary | ICD-10-CM | POA: Diagnosis not present

## 2022-02-28 DIAGNOSIS — C61 Malignant neoplasm of prostate: Secondary | ICD-10-CM | POA: Diagnosis not present

## 2022-02-28 DIAGNOSIS — I10 Essential (primary) hypertension: Secondary | ICD-10-CM | POA: Diagnosis not present

## 2022-03-01 DIAGNOSIS — C61 Malignant neoplasm of prostate: Secondary | ICD-10-CM | POA: Diagnosis not present

## 2022-03-09 DIAGNOSIS — C61 Malignant neoplasm of prostate: Secondary | ICD-10-CM | POA: Diagnosis not present

## 2022-03-18 DIAGNOSIS — R339 Retention of urine, unspecified: Secondary | ICD-10-CM | POA: Diagnosis not present

## 2022-03-25 DIAGNOSIS — C61 Malignant neoplasm of prostate: Secondary | ICD-10-CM | POA: Diagnosis not present

## 2022-03-25 DIAGNOSIS — R972 Elevated prostate specific antigen [PSA]: Secondary | ICD-10-CM | POA: Diagnosis not present

## 2022-03-28 DIAGNOSIS — I10 Essential (primary) hypertension: Secondary | ICD-10-CM | POA: Diagnosis not present

## 2022-03-28 DIAGNOSIS — T386X5A Adverse effect of antigonadotrophins, antiestrogens, antiandrogens, not elsewhere classified, initial encounter: Secondary | ICD-10-CM | POA: Diagnosis not present

## 2022-03-28 DIAGNOSIS — R69 Illness, unspecified: Secondary | ICD-10-CM | POA: Diagnosis not present

## 2022-03-28 DIAGNOSIS — C7951 Secondary malignant neoplasm of bone: Secondary | ICD-10-CM | POA: Diagnosis not present

## 2022-03-28 DIAGNOSIS — R972 Elevated prostate specific antigen [PSA]: Secondary | ICD-10-CM | POA: Diagnosis not present

## 2022-03-28 DIAGNOSIS — R232 Flushing: Secondary | ICD-10-CM | POA: Diagnosis not present

## 2022-03-28 DIAGNOSIS — Z192 Hormone resistant malignancy status: Secondary | ICD-10-CM | POA: Diagnosis not present

## 2022-03-28 DIAGNOSIS — Z79899 Other long term (current) drug therapy: Secondary | ICD-10-CM | POA: Diagnosis not present

## 2022-03-28 DIAGNOSIS — C61 Malignant neoplasm of prostate: Secondary | ICD-10-CM | POA: Diagnosis not present

## 2022-03-31 DIAGNOSIS — I745 Embolism and thrombosis of iliac artery: Secondary | ICD-10-CM | POA: Diagnosis not present

## 2022-03-31 DIAGNOSIS — R0602 Shortness of breath: Secondary | ICD-10-CM | POA: Diagnosis not present

## 2022-03-31 DIAGNOSIS — C7951 Secondary malignant neoplasm of bone: Secondary | ICD-10-CM | POA: Diagnosis not present

## 2022-03-31 DIAGNOSIS — M545 Low back pain, unspecified: Secondary | ICD-10-CM | POA: Diagnosis not present

## 2022-03-31 DIAGNOSIS — R935 Abnormal findings on diagnostic imaging of other abdominal regions, including retroperitoneum: Secondary | ICD-10-CM | POA: Diagnosis not present

## 2022-03-31 DIAGNOSIS — R9431 Abnormal electrocardiogram [ECG] [EKG]: Secondary | ICD-10-CM | POA: Diagnosis not present

## 2022-04-04 DIAGNOSIS — C7951 Secondary malignant neoplasm of bone: Secondary | ICD-10-CM | POA: Diagnosis not present

## 2022-04-04 DIAGNOSIS — G893 Neoplasm related pain (acute) (chronic): Secondary | ICD-10-CM | POA: Diagnosis not present

## 2022-04-04 DIAGNOSIS — R066 Hiccough: Secondary | ICD-10-CM | POA: Diagnosis not present

## 2022-04-04 DIAGNOSIS — M545 Low back pain, unspecified: Secondary | ICD-10-CM | POA: Diagnosis not present

## 2022-04-04 DIAGNOSIS — K5901 Slow transit constipation: Secondary | ICD-10-CM | POA: Diagnosis not present

## 2022-04-04 DIAGNOSIS — Z79891 Long term (current) use of opiate analgesic: Secondary | ICD-10-CM | POA: Diagnosis not present

## 2022-04-04 DIAGNOSIS — Z8546 Personal history of malignant neoplasm of prostate: Secondary | ICD-10-CM | POA: Diagnosis not present

## 2022-04-04 DIAGNOSIS — Z515 Encounter for palliative care: Secondary | ICD-10-CM | POA: Diagnosis not present

## 2022-04-04 DIAGNOSIS — I10 Essential (primary) hypertension: Secondary | ICD-10-CM | POA: Diagnosis not present

## 2022-04-04 DIAGNOSIS — N319 Neuromuscular dysfunction of bladder, unspecified: Secondary | ICD-10-CM | POA: Diagnosis not present

## 2022-04-04 DIAGNOSIS — R5383 Other fatigue: Secondary | ICD-10-CM | POA: Diagnosis not present

## 2022-04-04 DIAGNOSIS — C61 Malignant neoplasm of prostate: Secondary | ICD-10-CM | POA: Diagnosis not present

## 2022-04-04 DIAGNOSIS — R339 Retention of urine, unspecified: Secondary | ICD-10-CM | POA: Diagnosis not present

## 2022-04-04 DIAGNOSIS — K5900 Constipation, unspecified: Secondary | ICD-10-CM | POA: Diagnosis not present

## 2022-04-04 DIAGNOSIS — R530 Neoplastic (malignant) related fatigue: Secondary | ICD-10-CM | POA: Diagnosis not present

## 2022-04-04 DIAGNOSIS — R69 Illness, unspecified: Secondary | ICD-10-CM | POA: Diagnosis not present

## 2022-04-04 DIAGNOSIS — R531 Weakness: Secondary | ICD-10-CM | POA: Diagnosis not present

## 2022-04-05 DIAGNOSIS — Z789 Other specified health status: Secondary | ICD-10-CM | POA: Diagnosis not present

## 2022-04-05 DIAGNOSIS — Z9181 History of falling: Secondary | ICD-10-CM | POA: Diagnosis not present

## 2022-04-05 DIAGNOSIS — R3912 Poor urinary stream: Secondary | ICD-10-CM | POA: Diagnosis not present

## 2022-04-05 DIAGNOSIS — R399 Unspecified symptoms and signs involving the genitourinary system: Secondary | ICD-10-CM | POA: Diagnosis not present

## 2022-04-05 DIAGNOSIS — C61 Malignant neoplasm of prostate: Secondary | ICD-10-CM | POA: Diagnosis not present

## 2022-04-05 DIAGNOSIS — D494 Neoplasm of unspecified behavior of bladder: Secondary | ICD-10-CM | POA: Diagnosis not present

## 2022-04-05 DIAGNOSIS — R339 Retention of urine, unspecified: Secondary | ICD-10-CM | POA: Diagnosis not present

## 2022-04-06 DIAGNOSIS — C61 Malignant neoplasm of prostate: Secondary | ICD-10-CM | POA: Diagnosis not present

## 2022-04-14 DIAGNOSIS — C7951 Secondary malignant neoplasm of bone: Secondary | ICD-10-CM | POA: Diagnosis not present

## 2022-04-14 DIAGNOSIS — Z510 Encounter for antineoplastic radiation therapy: Secondary | ICD-10-CM | POA: Diagnosis not present

## 2022-04-14 DIAGNOSIS — C61 Malignant neoplasm of prostate: Secondary | ICD-10-CM | POA: Diagnosis not present

## 2022-04-18 DIAGNOSIS — R339 Retention of urine, unspecified: Secondary | ICD-10-CM | POA: Diagnosis not present

## 2022-05-02 DIAGNOSIS — G893 Neoplasm related pain (acute) (chronic): Secondary | ICD-10-CM | POA: Diagnosis not present

## 2022-05-02 DIAGNOSIS — Z515 Encounter for palliative care: Secondary | ICD-10-CM | POA: Diagnosis not present

## 2022-05-02 DIAGNOSIS — K5901 Slow transit constipation: Secondary | ICD-10-CM | POA: Diagnosis not present

## 2022-05-02 DIAGNOSIS — C61 Malignant neoplasm of prostate: Secondary | ICD-10-CM | POA: Diagnosis not present

## 2022-05-02 DIAGNOSIS — C7951 Secondary malignant neoplasm of bone: Secondary | ICD-10-CM | POA: Diagnosis not present

## 2022-05-09 DIAGNOSIS — C7951 Secondary malignant neoplasm of bone: Secondary | ICD-10-CM | POA: Diagnosis not present

## 2022-05-09 DIAGNOSIS — C61 Malignant neoplasm of prostate: Secondary | ICD-10-CM | POA: Diagnosis not present

## 2022-05-09 DIAGNOSIS — R972 Elevated prostate specific antigen [PSA]: Secondary | ICD-10-CM | POA: Diagnosis not present

## 2022-05-16 DIAGNOSIS — R339 Retention of urine, unspecified: Secondary | ICD-10-CM | POA: Diagnosis not present

## 2022-05-19 DIAGNOSIS — C7951 Secondary malignant neoplasm of bone: Secondary | ICD-10-CM | POA: Diagnosis not present

## 2022-05-19 DIAGNOSIS — Z510 Encounter for antineoplastic radiation therapy: Secondary | ICD-10-CM | POA: Diagnosis not present

## 2022-05-19 DIAGNOSIS — C61 Malignant neoplasm of prostate: Secondary | ICD-10-CM | POA: Diagnosis not present

## 2022-05-29 DIAGNOSIS — Z923 Personal history of irradiation: Secondary | ICD-10-CM | POA: Diagnosis not present

## 2022-05-29 DIAGNOSIS — Z79899 Other long term (current) drug therapy: Secondary | ICD-10-CM | POA: Diagnosis not present

## 2022-05-29 DIAGNOSIS — N3289 Other specified disorders of bladder: Secondary | ICD-10-CM | POA: Diagnosis not present

## 2022-05-29 DIAGNOSIS — R319 Hematuria, unspecified: Secondary | ICD-10-CM | POA: Diagnosis not present

## 2022-05-29 DIAGNOSIS — F17210 Nicotine dependence, cigarettes, uncomplicated: Secondary | ICD-10-CM | POA: Diagnosis not present

## 2022-05-29 DIAGNOSIS — C61 Malignant neoplasm of prostate: Secondary | ICD-10-CM | POA: Diagnosis not present

## 2022-05-29 DIAGNOSIS — K5900 Constipation, unspecified: Secondary | ICD-10-CM | POA: Diagnosis not present

## 2022-05-29 DIAGNOSIS — R310 Gross hematuria: Secondary | ICD-10-CM | POA: Diagnosis not present

## 2022-05-29 DIAGNOSIS — C7951 Secondary malignant neoplasm of bone: Secondary | ICD-10-CM | POA: Diagnosis not present

## 2022-05-30 DIAGNOSIS — N3289 Other specified disorders of bladder: Secondary | ICD-10-CM | POA: Diagnosis not present

## 2022-05-30 DIAGNOSIS — C61 Malignant neoplasm of prostate: Secondary | ICD-10-CM | POA: Diagnosis not present

## 2022-05-30 DIAGNOSIS — R310 Gross hematuria: Secondary | ICD-10-CM | POA: Diagnosis not present

## 2022-07-09 DEATH — deceased
